# Patient Record
Sex: Female | Born: 1990 | Race: Black or African American | Hispanic: No | Marital: Single | Smoking: Never smoker
Health system: Southern US, Community
[De-identification: ages and names within clinical notes are randomized; demographics above are authoritative.]

## PROBLEM LIST (undated history)

## (undated) DIAGNOSIS — N949 Unspecified condition associated with female genital organs and menstrual cycle: Secondary | ICD-10-CM

## (undated) HISTORY — DX: Unspecified condition associated with female genital organs and menstrual cycle: N94.9

---
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
## 2009-01-12 ENCOUNTER — Emergency Department: Payer: Self-pay | Admitting: Emergency Medicine

## 2012-06-22 ENCOUNTER — Emergency Department: Payer: Self-pay | Admitting: Emergency Medicine

## 2012-12-06 ENCOUNTER — Emergency Department: Payer: Self-pay | Admitting: Emergency Medicine

## 2012-12-06 LAB — CBC
HCT: 30.3 % — ABNORMAL LOW (ref 35.0–47.0)
HGB: 10.3 g/dL — ABNORMAL LOW (ref 12.0–16.0)
MCHC: 33.9 g/dL (ref 32.0–36.0)
RBC: 3.59 10*6/uL — ABNORMAL LOW (ref 3.80–5.20)

## 2012-12-06 LAB — URINALYSIS, COMPLETE
Bilirubin,UR: NEGATIVE
Nitrite: NEGATIVE
Ph: 6 (ref 4.5–8.0)
WBC UR: 8 /HPF (ref 0–5)

## 2012-12-06 LAB — COMPREHENSIVE METABOLIC PANEL
Alkaline Phosphatase: 67 U/L (ref 50–136)
Anion Gap: 4 — ABNORMAL LOW (ref 7–16)
BUN: 9 mg/dL (ref 7–18)
Chloride: 108 mmol/L — ABNORMAL HIGH (ref 98–107)
EGFR (African American): >60
EGFR (Non-African Amer.): >60
Glucose: 63 mg/dL — ABNORMAL LOW (ref 65–99)
Osmolality: 274 (ref 275–301)
Potassium: 3.5 mmol/L (ref 3.5–5.1)
SGOT(AST): 22 U/L (ref 15–37)
Sodium: 139 mmol/L (ref 136–145)

## 2012-12-07 ENCOUNTER — Ambulatory Visit: Payer: Self-pay | Admitting: Physician Assistant

## 2013-08-20 ENCOUNTER — Emergency Department: Payer: Self-pay | Admitting: Emergency Medicine

## 2013-10-25 ENCOUNTER — Emergency Department: Payer: Self-pay | Admitting: Emergency Medicine

## 2013-12-07 ENCOUNTER — Emergency Department: Payer: Self-pay | Admitting: Emergency Medicine

## 2014-04-28 ENCOUNTER — Emergency Department: Payer: Self-pay | Admitting: Emergency Medicine

## 2014-08-28 ENCOUNTER — Emergency Department: Payer: Self-pay | Admitting: Emergency Medicine

## 2014-08-28 LAB — COMPREHENSIVE METABOLIC PANEL
ALK PHOS: 48 U/L
ALT: 17 U/L
AST: 25 U/L (ref 15–37)
Albumin: 3.4 g/dL (ref 3.4–5.0)
Anion Gap: 5 — ABNORMAL LOW (ref 7–16)
BUN: 5 mg/dL — ABNORMAL LOW (ref 7–18)
Bilirubin,Total: 0.9 mg/dL (ref 0.2–1.0)
CREATININE: 0.72 mg/dL (ref 0.60–1.30)
Calcium, Total: 8.6 mg/dL (ref 8.5–10.1)
Chloride: 106 mmol/L (ref 98–107)
Co2: 29 mmol/L (ref 21–32)
EGFR (African American): >60
EGFR (Non-African Amer.): >60
Glucose: 79 mg/dL (ref 65–99)
Osmolality: 276 (ref 275–301)
Potassium: 3.5 mmol/L (ref 3.5–5.1)
Sodium: 140 mmol/L (ref 136–145)
TOTAL PROTEIN: 6.9 g/dL (ref 6.4–8.2)

## 2014-08-28 LAB — URINALYSIS, COMPLETE
Bacteria: NONE SEEN
Bilirubin,UR: NEGATIVE
Blood: NEGATIVE
Glucose,UR: NEGATIVE mg/dL (ref 0–75)
Ketone: NEGATIVE
LEUKOCYTE ESTERASE: NEGATIVE
NITRITE: NEGATIVE
Ph: 7 (ref 4.5–8.0)
Protein: NEGATIVE
RBC,UR: <1 /HPF (ref 0–5)
SPECIFIC GRAVITY: 1.01 (ref 1.003–1.030)

## 2014-08-28 LAB — CBC WITH DIFFERENTIAL/PLATELET
BASOS ABS: 0.1 10*3/uL (ref 0.0–0.1)
Basophil %: 0.7 %
Eosinophil #: 0.1 10*3/uL (ref 0.0–0.7)
Eosinophil %: 1 %
HCT: 34.2 % — ABNORMAL LOW (ref 35.0–47.0)
HGB: 11.5 g/dL — ABNORMAL LOW (ref 12.0–16.0)
LYMPHS PCT: 22.5 %
Lymphocyte #: 2.1 10*3/uL (ref 1.0–3.6)
MCH: 29.5 pg (ref 26.0–34.0)
MCHC: 33.8 g/dL (ref 32.0–36.0)
MCV: 87 fL (ref 80–100)
MONOS PCT: 5.6 %
Monocyte #: 0.5 x10 3/mm (ref 0.2–0.9)
NEUTROS ABS: 6.5 10*3/uL (ref 1.4–6.5)
Neutrophil %: 70.2 %
Platelet: 263 10*3/uL (ref 150–440)
RBC: 3.92 10*6/uL (ref 3.80–5.20)
RDW: 14.4 % (ref 11.5–14.5)
WBC: 9.2 10*3/uL (ref 3.6–11.0)

## 2015-01-30 ENCOUNTER — Encounter: Payer: Self-pay | Admitting: Emergency Medicine

## 2015-01-30 ENCOUNTER — Emergency Department
Admission: EM | Admit: 2015-01-30 | Discharge: 2015-01-30 | Disposition: A | Discharge status: Home / Self Care | Payer: Self-pay | Attending: Emergency Medicine | Admitting: Emergency Medicine

## 2015-01-30 ENCOUNTER — Other Ambulatory Visit: Payer: Self-pay

## 2015-01-30 DIAGNOSIS — Y9389 Activity, other specified: Secondary | ICD-10-CM | POA: Insufficient documentation

## 2015-01-30 DIAGNOSIS — Y9289 Other specified places as the place of occurrence of the external cause: Secondary | ICD-10-CM | POA: Insufficient documentation

## 2015-01-30 DIAGNOSIS — S29011A Strain of muscle and tendon of front wall of thorax, initial encounter: Secondary | ICD-10-CM | POA: Insufficient documentation

## 2015-01-30 DIAGNOSIS — Y990 Civilian activity done for income or pay: Secondary | ICD-10-CM | POA: Insufficient documentation

## 2015-01-30 DIAGNOSIS — X58XXXA Exposure to other specified factors, initial encounter: Secondary | ICD-10-CM | POA: Insufficient documentation

## 2015-01-30 DIAGNOSIS — R0789 Other chest pain: Secondary | ICD-10-CM

## 2015-01-30 NOTE — ED Notes (Signed)
Pt to ed with c/o chest pain that started yesterday late in the afternoon. Pt reports she was at work yesterday when it started on the left side of her chest radiating to the back.  Reports she went home and rested.  This am awoke and started having chest pain again, sob and dizziness.

## 2015-01-30 NOTE — Discharge Instructions (Signed)
This discomfort appears to be musculoskeletal.  Take ibuprofen, 3 tablets (600 mg) 3 times a day for up to 3 days. Continue light activity as tolerated. Return to the emergency department if you worsening pain, shortness of breath, or if you have other urgent concerns. Chest Wall Pain Chest wall pain is pain in or around the bones and muscles of your chest. It may take up to 6 weeks to get better. It may take longer if you must stay physically active in your work and activities.  CAUSES  Chest wall pain may happen on its own. However, it may be caused by:  A viral illness like the flu.  Injury.  Coughing.  Exercise.  Arthritis.  Fibromyalgia.  Shingles. HOME CARE INSTRUCTIONS   Avoid overtiring physical activity. Try not to strain or perform activities that cause pain. This includes any activities using your chest or your abdominal and side muscles, especially if heavy weights are used.  Put ice on the sore area.  Put ice in a plastic bag.  Place a towel between your skin and the bag.  Leave the ice on for 15-20 minutes per hour while awake for the first 2 days.  Only take over-the-counter or prescription medicines for pain, discomfort, or fever as directed by your caregiver. SEEK IMMEDIATE MEDICAL CARE IF:   Your pain increases, or you are very uncomfortable.  You have a fever.  Your chest pain becomes worse.  You have new, unexplained symptoms.  You have nausea or vomiting.  You feel sweaty or lightheaded.  You have a cough with phlegm (sputum), or you cough up blood. MAKE SURE YOU:   Understand these instructions.  Will watch your condition.  Will get help right away if you are not doing well or get worse. Document Released: 07/21/2005 Document Revised: 10/13/2011 Document Reviewed: 03/17/2011 [facility] Patient Information 2015 [place], [place]. This information is not intended to replace advice given to you by your health care provider. Make sure you  discuss any questions you have with your health care provider.

## 2015-01-30 NOTE — ED Provider Notes (Signed)
[facility] Emergency Department Provider Note  ____________________________________________  Time seen:  [id number]  I have reviewed the triage vital signs and the nursing notes.   HISTORY  Chief Complaint Chest Pain     HPI Tonya Rodriguez is a 24 y.o. female reports she began to have chest pain yesterday while at work. She works in [employer]. She needs to lift items at work and was less comfortable with this yesterday, but she does not recall any injury or initiating event. The discomfort is in the left chest but also wraps around the chest wall and is in the left back a little bit. She slept through the night but woke this morning with additional pain in her left chest. She denies any shortness of breath. She has not taken any medications or tried any other therapeutics at this stage.  Patient denies any significant past medical history. She does not have hypertension, diabetes, heart disease, or respiratory issues including asthma.    History reviewed. No pertinent past medical history.  There are no active problems to display for this patient.   History reviewed. No pertinent past surgical history.  No current outpatient prescriptions on file.  Allergies Review of patient's allergies indicates no known allergies.  Family History  Problem Relation Age of Onset  . Cancer Mother   . Hypertension Father     Social History History  Substance Use Topics  . Smoking status: Never Smoker   . Smokeless tobacco: Not on file  . Alcohol Use: Yes    Review of Systems  Constitutional: Negative for fever. ENT: Negative for sore throat. Cardiovascular: Chest pain, see history of present illness Respiratory: Negative for shortness of breath. Gastrointestinal: Negative for abdominal pain, vomiting and diarrhea. Genitourinary: Negative for dysuria. Musculoskeletal: Left chest pain Skin: Negative for rash. Neurological: Negative for headaches   10-point  ROS otherwise negative.  ____________________________________________   PHYSICAL EXAM:  VITAL SIGNS: ED Triage Vitals  Enc Vitals Group     BP 01/30/15 0723 113/69 mmHg     Pulse Rate 01/30/15 0723 66     Resp 01/30/15 0723 18     Temp 01/30/15 0723 97.9 F (36.6 C)     Temp Source 01/30/15 0723 Oral     SpO2 01/30/15 0723 100 %     Weight 01/30/15 0723 170 lb (77.111 kg)     Height 01/30/15 0723 5\' 7"  (1.702 m)     Head Cir --      Peak Flow --      Pain Score 01/30/15 0725 6     Pain Loc --      Pain Edu? --      Excl. in GC? --     Constitutional: Alert and oriented. Well appearing and in no distress. ENT   Head: Normocephalic and atraumatic.   Nose: No congestion/rhinnorhea. Cardiovascular: Normal rate, regular rhythm, no murmur noted Respiratory:  Normal respiratory effort, no tachypnea.    Breath sounds are clear and equal bilaterally.  Chest wall: Notable tenderness to the left pectoralis muscle that extends into the sternum. She also has discomfort on palpation around the left lower ribs and the area lateral and inferior to her left scapula. There is no left paraspinal muscle tenderness. Gastrointestinal: Soft and nontender. No distention.  Back: No muscle spasm, no tenderness, no CVA tenderness. Musculoskeletal: No deformity noted. Nontender with normal range of motion in all extremities.  No noted edema. Neurologic:  Normal speech and language. No  gross focal neurologic deficits are appreciated.  Skin:  Skin is warm, dry. No rash noted. Psychiatric: Mood and affect are normal. Speech and behavior are normal.  ____________________________________________    EKG  ED ECG REPORT I, Sonna Lipsky W, the attending physician, personally viewed and interpreted this ECG.   Date: 01/30/2015  EKG Time: 724  Rate: 74  Rhythm: Normal sinus rhythm with sinus arrhythmia  Axis: Normal  Intervals: Normal  ST&T Change: None  noted   ____________________________________________   INITIAL IMPRESSION / ASSESSMENT AND PLAN / ED COURSE  This healthy well-appearing 24 year old female appears to have musculoskeletal pain in her left chest that wraps around near the lateral portion of her left scapula. She is breathing well and is in no acute distress. Her EKG is normal.  We have counseled her on the treatment for this musculoskeletal discomfort. This will include ibuprofen, 600 mg, 3 times a day, for 3 days. I have counseled her to continue light activity but to be careful not to overuse or strain the muscles involved.  The patient appears well and agrees with this treatment plan. I advised her to return to the emergency department if she has any further urgent concerns.  ____________________________________________   FINAL CLINICAL IMPRESSION(S) / ED DIAGNOSES  Final diagnoses:  Chest wall pain  Strain of left pectoralis muscle, initial encounter      Darien Ramus, MD 01/30/15 [phone]

## 2015-04-23 ENCOUNTER — Emergency Department
Admission: EM | Admit: 2015-04-23 | Discharge: 2015-04-23 | Disposition: A | Discharge status: Home / Self Care | Payer: No Typology Code available for payment source | Attending: Emergency Medicine | Admitting: Emergency Medicine

## 2015-04-23 ENCOUNTER — Encounter: Payer: Self-pay | Admitting: Emergency Medicine

## 2015-04-23 DIAGNOSIS — Y9389 Activity, other specified: Secondary | ICD-10-CM | POA: Diagnosis not present

## 2015-04-23 DIAGNOSIS — Y998 Other external cause status: Secondary | ICD-10-CM | POA: Diagnosis not present

## 2015-04-23 DIAGNOSIS — Y92410 Unspecified street and highway as the place of occurrence of the external cause: Secondary | ICD-10-CM | POA: Diagnosis not present

## 2015-04-23 DIAGNOSIS — S46911A Strain of unspecified muscle, fascia and tendon at shoulder and upper arm level, right arm, initial encounter: Secondary | ICD-10-CM | POA: Diagnosis not present

## 2015-04-23 DIAGNOSIS — S4991XA Unspecified injury of right shoulder and upper arm, initial encounter: Secondary | ICD-10-CM | POA: Diagnosis present

## 2015-04-23 MED ORDER — MELOXICAM 15 MG PO TABS: 15.0000 mg | ORAL_TABLET | Freq: Every day | ORAL | Status: DC

## 2015-04-23 MED ORDER — CYCLOBENZAPRINE HCL 10 MG PO TABS: 10.0000 mg | ORAL_TABLET | Freq: Three times a day (TID) | ORAL | Status: DC | PRN

## 2015-04-23 NOTE — Discharge Instructions (Signed)
Joint Sprain °A sprain is a tear or stretch in the ligaments that hold a joint together. Severe sprains may need as long as 3-6 weeks of immobilization and/or exercises to heal completely. Sprained joints should be rested and protected. If not, they can become unstable and prone to re-injury. Proper treatment can reduce your pain, shorten the period of disability, and reduce the risk of repeated injuries. °TREATMENT  °· Rest and elevate the injured joint to reduce pain and swelling. °· Apply ice packs to the injury for 20-30 minutes every 2-3 hours for the next 2-3 days. °· Keep the injury wrapped in a compression bandage or splint as long as the joint is painful or as instructed by your caregiver. °· Do not use the injured joint until it is completely healed to prevent re-injury and chronic instability. Follow the instructions of your caregiver. °· Long-term sprain management may require exercises and/or treatment by a physical therapist. Taping or special braces may help stabilize the joint until it is completely better. °SEEK MEDICAL CARE IF:  °· You develop increased pain or swelling of the joint. °· You develop increasing redness and warmth of the joint. °· You develop a fever. °· It becomes stiff. °· Your hand or foot gets cold or numb. °Document Released: 08/28/2004 Document Revised: 10/13/2011 Document Reviewed: 08/07/2008 °ExitCare® Patient Information ©2015 [employer]. This information is not intended to replace advice given to you by your health care provider. Make sure you discuss any questions you have with your health care provider. ° °
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
## 2015-07-02 ENCOUNTER — Emergency Department
Admission: EM | Admit: 2015-07-02 | Discharge: 2015-07-02 | Disposition: A | Discharge status: Home / Self Care | Payer: Self-pay | Attending: Emergency Medicine | Admitting: Emergency Medicine

## 2015-07-02 ENCOUNTER — Encounter: Payer: Self-pay | Admitting: Medical Oncology

## 2015-07-02 DIAGNOSIS — H9201 Otalgia, right ear: Secondary | ICD-10-CM | POA: Insufficient documentation

## 2015-07-02 DIAGNOSIS — J069 Acute upper respiratory infection, unspecified: Secondary | ICD-10-CM | POA: Insufficient documentation

## 2015-07-02 DIAGNOSIS — H9191 Unspecified hearing loss, right ear: Secondary | ICD-10-CM | POA: Insufficient documentation

## 2015-07-02 DIAGNOSIS — Z791 Long term (current) use of non-steroidal anti-inflammatories (NSAID): Secondary | ICD-10-CM | POA: Insufficient documentation

## 2015-07-02 MED ORDER — PREDNISONE 10 MG PO TABS: ORAL_TABLET | ORAL | Status: DC

## 2015-07-02 NOTE — ED Provider Notes (Signed)
[facility] Emergency Department Provider Note  ____________________________________________  Time seen: Approximately 8:41 AM  I have reviewed the triage vital signs and the nursing notes.   HISTORY  Chief Complaint Sore Throat and Otalgia  HPI Tonya Rodriguez is a 24 y.o. female Saturday with complaint of sore throat and right ear pain beginning last night.  Patient has not taken any over-the-counter medication for her symptoms. She denies any fever or chills. She has had some nasal congestion and cold symptoms but has not taken any over-the-counter medication for this. She denies the use of tobacco. She states that hearing from her right ear is decreased in comparison to the left. Pain is time is 6 out of 10.   History reviewed. No pertinent past medical history.  There are no active problems to display for this patient.   History reviewed. No pertinent past surgical history.  Current Outpatient Rx  Name  Route  Sig  Dispense  Refill  . cyclobenzaprine (FLEXERIL) 10 MG tablet   Oral   Take 1 tablet (10 mg total) by mouth 3 (three) times daily as needed for muscle spasms.   15 tablet   0   . meloxicam (MOBIC) 15 MG tablet   Oral   Take 1 tablet (15 mg total) by mouth daily.   30 tablet   0   . predniSONE (DELTASONE) 10 MG tablet      Take 3 tablet once a day for 3 days   9 tablet   0     Allergies Review of patient's allergies indicates no known allergies.  Family History  Problem Relation Age of Onset  . Cancer Mother   . Hypertension Father     Social History Social History  Substance Use Topics  . Smoking status: Never Smoker   . Smokeless tobacco: None  . Alcohol Use: Yes    Review of Systems Constitutional: No fever/chills Eyes: No visual changes. ENT: No sore throat. Nasal congestion Decreased hearing right ear Cardiovascular: Denies chest pain. Respiratory: Denies shortness of breath. Gastrointestinal No nausea, no  vomiting.  Musculoskeletal: Negative for back pain. Skin: Negative for rash. Neurological: Negative for headaches, focal weakness or numbness.  10-point ROS otherwise negative.  ____________________________________________   PHYSICAL EXAM:  VITAL SIGNS: ED Triage Vitals  Enc Vitals Group     BP 07/02/15 0751 121/66 mmHg     Pulse Rate 07/02/15 0751 65     Resp 07/02/15 0751 17     Temp 07/02/15 0751 97.7 F (36.5 C)     Temp Source 07/02/15 0751 Oral     SpO2 07/02/15 0751 99 %     Weight 07/02/15 0751 149 lb (67.586 kg)     Height 07/02/15 0751  (1.702 m)     Head Cir --      Peak Flow --      Pain Score 07/02/15 0751 6     Pain Loc --      Pain Edu? --      Excl. in GC? --     Constitutional: Alert and oriented. Well appearing and in no acute distress. Eyes: Conjunctivae are normal. PERRL. EOMI. Head: Atraumatic. Nose: Positive nasal congestion/rhinnorhea. Left EAC and TM are dull. Right EAC is clear right TM with mild fluid present no erythema seen. Mouth/Throat: Mucous membranes are moist.  Oropharynx non-erythematous. Posterior pharynx with drainage present. Neck: No stridor.   Hematological/Lymphatic/Immunilogical: No cervical lymphadenopathy. Cardiovascular: Normal rate, regular rhythm. Grossly normal  heart sounds.  Good peripheral circulation. Respiratory: Normal respiratory effort.  No retractions. Lungs CTAB. Gastrointestinal: Soft and nontender. No distention. No abdominal bruits. No CVA tenderness. Musculoskeletal: No lower extremity tenderness nor edema.  No joint effusions. Neurologic:  Normal speech and language. No gross focal neurologic deficits are appreciated. No gait instability. Skin:  Skin is warm, dry and intact. No rash noted. Psychiatric: Mood and affect are normal. Speech and behavior are normal.  ____________________________________________   LABS (all labs ordered are listed, but only abnormal results are displayed)  Labs Reviewed -  No data to display   PROCEDURES  Procedure(s) performed: None  Critical Care performed: No  ____________________________________________   INITIAL IMPRESSION / ASSESSMENT AND PLAN / ED COURSE  Pertinent labs & imaging results that were available during my care of the patient were reviewed by me and considered in my medical decision making (see chart for details).  Patient is told to obtain over-the-counter medication such as Claritin D or Zyrtec-D for nasal congestion. She is also given a prescription for prednisone 10 mg 3 mg once a day for 3 days. ____________________________________________   FINAL CLINICAL IMPRESSION(S) / ED DIAGNOSES  Final diagnoses:  Acute upper respiratory infection  Otalgia of right ear      Tommi RumpsRhonda L Keesha Pellum, PA-C 07/02/15 1147  Emily FilbertJonathan E Williams, MD 07/02/15 [phone]

## 2015-07-02 NOTE — Discharge Instructions (Signed)
Increase fluids, take prednisone as directed. Up to a and Sudafed over-the-counter for congestion and ear pain. Tylenol or Advil as needed for ear pain. Follow-up with Dr. Elenore RotaJuengel if any continued problems with her ear.

## 2015-07-02 NOTE — ED Notes (Signed)
Pt reports sore throat and rt ear pain that began last night without fever.

## 2015-08-06 ENCOUNTER — Emergency Department
Admission: EM | Admit: 2015-08-06 | Discharge: 2015-08-06 | Disposition: A | Discharge status: Home / Self Care | Payer: Self-pay | Attending: Emergency Medicine | Admitting: Emergency Medicine

## 2015-08-06 ENCOUNTER — Emergency Department: Payer: Self-pay

## 2015-08-06 ENCOUNTER — Encounter: Payer: Self-pay | Admitting: Emergency Medicine

## 2015-08-06 DIAGNOSIS — Z791 Long term (current) use of non-steroidal anti-inflammatories (NSAID): Secondary | ICD-10-CM | POA: Insufficient documentation

## 2015-08-06 DIAGNOSIS — M7918 Myalgia, other site: Secondary | ICD-10-CM

## 2015-08-06 DIAGNOSIS — M25511 Pain in right shoulder: Secondary | ICD-10-CM

## 2015-08-06 MED ORDER — TRAMADOL HCL 50 MG PO TABS
50.0000 mg | ORAL_TABLET | Freq: Once | ORAL | Status: AC
  Administered 2015-08-06: 50 mg via ORAL
  Filled 2015-08-06: qty 1

## 2015-08-06 MED ORDER — TRAMADOL HCL 50 MG PO TABS
50.0000 mg | ORAL_TABLET | Freq: Four times a day (QID) | ORAL | Status: DC | PRN
Start: 2015-08-06 — End: 2016-05-08

## 2015-08-06 MED ORDER — KETOROLAC TROMETHAMINE 60 MG/2ML IM SOLN
60.0000 mg | Freq: Once | INTRAMUSCULAR | Status: AC
  Administered 2015-08-06: 60 mg via INTRAMUSCULAR
  Filled 2015-08-06: qty 2

## 2015-08-06 NOTE — Discharge Instructions (Signed)
Joint Pain  Joint pain, which is also called arthralgia, can be caused by many things. Joint pain often goes away when you follow your health care provider's instructions for relieving pain at home. However, joint pain can also be caused by conditions that require further treatment. Common causes of joint pain include:   Bruising in the area of the joint.   Overuse of the joint.   Wear and tear on the joints that occur with aging (osteoarthritis).   Various other forms of arthritis.   A buildup of a crystal form of uric acid in the joint (gout).   Infections of the joint (septic arthritis) or of the bone (osteomyelitis).  Your health care provider may recommend medicine to help with the pain. If your joint pain continues, additional tests may be needed to diagnose your condition.  HOME CARE INSTRUCTIONS  Watch your condition for any changes. Follow these instructions as directed to lessen the pain that you are feeling.   Take medicines only as directed by your health care provider.   Rest the affected area for as long as your health care provider says that you should. If directed to do so, raise the painful joint above the level of your heart while you are sitting or lying down.   Do not do things that cause or worsen pain.   If directed, apply ice to the painful area:    Put ice in a plastic bag.    Place a towel between your skin and the bag.    Leave the ice on for 20 minutes, 2-3 times per day.   Wear an elastic bandage, splint, or sling as directed by your health care provider. Loosen the elastic bandage or splint if your fingers or toes become numb and tingle, or if they turn cold and blue.   Begin exercising or stretching the affected area as directed by your health care provider. Ask your health care provider what types of exercise are safe for you.   Keep all follow-up visits as directed by your health care provider. This is important.  SEEK MEDICAL CARE IF:   Your pain increases, and medicine  does not help.   Your joint pain does not improve within 3 days.   You have increased bruising or swelling.   You have a fever.   You lose 10 lb (4.5 kg) or more without trying.  SEEK IMMEDIATE MEDICAL CARE IF:   You are not able to move the joint.   Your fingers or toes become numb or they turn cold and blue.     This information is not intended to replace advice given to you by your health care provider. Make sure you discuss any questions you have with your health care provider.     Document Released: 07/21/2005 Document Revised: 08/11/2014 Document Reviewed: 05/02/2014  Elsevier Interactive Patient Education 2016 [employer]      Shoulder Pain  The shoulder is the joint that connects your arms to your body. The bones that form the shoulder joint include the upper arm bone (humerus), the shoulder blade (scapula), and the collarbone (clavicle). The top of the humerus is shaped like a ball and fits into a rather flat socket on the scapula (glenoid cavity). A combination of muscles and strong, fibrous tissues that connect muscles to bones (tendons) support your shoulder joint and hold the ball in the socket. Small, fluid-filled sacs (bursae) are located in different areas of the joint. They act as cushions between   the bones and the overlying soft tissues and help reduce friction between the gliding tendons and the bone as you move your arm. Your shoulder joint allows a wide range of motion in your arm. This range of motion allows you to do things like scratch your back or throw a ball. However, this range of motion also makes your shoulder more prone to pain from overuse and injury.  Causes of shoulder pain can originate from both injury and overuse and usually can be grouped in the following four categories:   Redness, swelling, and pain (inflammation) of the tendon (tendinitis) or the bursae (bursitis).   Instability, such as a dislocation of the joint.   Inflammation of the joint  (arthritis).   Broken bone (fracture).  HOME CARE INSTRUCTIONS    Apply ice to the sore area.    Put ice in a plastic bag.    Place a towel between your skin and the bag.    Leave the ice on for 15-20 minutes, 3-4 times per day for the first 2 days, or as directed by your health care provider.   Stop using cold packs if they do not help with the pain.   If you have a shoulder sling or immobilizer, wear it as long as your caregiver instructs. Only remove it to shower or bathe. Move your arm as little as possible, but keep your hand moving to prevent swelling.   Squeeze a soft ball or foam pad as much as possible to help prevent swelling.   Only take over-the-counter or prescription medicines for pain, discomfort, or fever as directed by your caregiver.  SEEK MEDICAL CARE IF:    Your shoulder pain increases, or new pain develops in your arm, hand, or fingers.   Your hand or fingers become cold and numb.   Your pain is not relieved with medicines.  SEEK IMMEDIATE MEDICAL CARE IF:    Your arm, hand, or fingers are numb or tingling.   Your arm, hand, or fingers are significantly swollen or turn white or blue.  MAKE SURE YOU:    Understand these instructions.   Will watch your condition.   Will get help right away if you are not doing well or get worse.     This information is not intended to replace advice given to you by your health care provider. Make sure you discuss any questions you have with your health care provider.     Document Released: 04/30/2005 Document Revised: 08/11/2014 Document Reviewed: 11/13/2014  Elsevier Interactive Patient Education 2016 [employer]vier Inc[employer]

## 2015-08-06 NOTE — ED Notes (Signed)
NAD noted at time of D/C. Pt denies questions or concerns. Pt ambulatory to the lobby at this time. Pt refused wheelchair to the lobby.  

## 2015-08-06 NOTE — ED Provider Notes (Signed)
[facility] Emergency Department Provider Note  ____________________________________________  Time seen: Approximately 549 AM  I have reviewed the triage vital signs and the nursing notes.   HISTORY  Chief Complaint Shoulder Pain    HPI Tonya Rodriguez is a 25 y.o. female comes into the hospital today with right shoulder pain. The patient reports it is uncomfortable and hurts down to her wrist. The patient reports that the pain started hurting on Thursday and she tried to brush it off. The patient is not taking anything for pain that has put heat on it. 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 The patient reports that the pain is all over her shoulder not just in one location and her pain as an 8 out of 10 in intensity. The patient could not tolerate the pain anymore so she decided to come in for evaluation.   History reviewed. No pertinent past medical history.  There are no active problems to display for this patient.   History reviewed. No pertinent past surgical history.  Current Outpatient Rx  Name  Route  Sig  Dispense  Refill  . cyclobenzaprine (FLEXERIL) 10 MG tablet   Oral   Take 1 tablet (10 mg total) by mouth 3 (three) times daily as needed for muscle spasms.   15 tablet   0   . meloxicam (MOBIC) 15 MG tablet   Oral   Take 1 tablet (15 mg total) by mouth daily.   30 tablet   0   . predniSONE (DELTASONE) 10 MG tablet      Take 3 tablet once a day for 3 days   9 tablet   0   . traMADol (ULTRAM) 50 MG tablet   Oral   Take 1 tablet (50 mg total) by mouth every 6 (six) hours as needed.   12 tablet   0     Allergies Review of patient's allergies indicates no known allergies.  Family History  Problem Relation Age of Onset  . Cancer Mother   . Hypertension Father     Social History Social History  Substance Use Topics  . Smoking status: Never Smoker   .  Smokeless tobacco: None  . Alcohol Use: Yes    Review of Systems Constitutional: No fever/chills Eyes: No visual changes. ENT: No sore throat. Cardiovascular: Denies chest pain. Respiratory: Denies shortness of breath. Gastrointestinal: No abdominal pain.  No nausea, no vomiting.  No diarrhea.  No constipation. Genitourinary: Negative for dysuria. Musculoskeletal: Right shoulder pain Skin: Negative for rash. Neurological: Negative for headaches, focal weakness or numbness.  10-point ROS otherwise negative.  ____________________________________________   PHYSICAL EXAM:  VITAL SIGNS: ED Triage Vitals  Enc Vitals Group     BP 08/06/15 0511 105/66 mmHg     Pulse Rate 08/06/15 0511 60     Resp 08/06/15 0511 18     Temp 08/06/15 0511 97.7 F (36.5 C)     Temp Source 08/06/15 0511 Oral     SpO2 08/06/15 0511 100 %     Weight 08/06/15 0511 160 lb (72.576 kg)     Height 08/06/15 0511 5\' 7"  (1.702 m)     Head Cir --      Peak Flow --      Pain Score --      Pain Loc --      Pain Edu? --  Excl. in GC? --     Constitutional: Alert and oriented. Well appearing and in mild distress. Eyes: Conjunctivae are normal. PERRL. EOMI. Head: Atraumatic. Nose: No congestion/rhinnorhea. Mouth/Throat: Mucous membranes are moist.  Oropharynx non-erythematous. Cardiovascular: Normal rate, regular rhythm. Grossly normal heart sounds.  Good peripheral circulation. Respiratory: Normal respiratory effort.  No retractions. Lungs CTAB. Gastrointestinal: Soft and nontender. No distention. Positive bowel sounds Musculoskeletal: No lower extremity tenderness nor edema. The patient has some right shoulder pain. Worse with range of motion that goes above level of her shoulder. Tenderness to palpation all over. Neurologic:  Normal speech and language.  Skin:  Skin is warm, dry and intact.  Psychiatric: Mood and affect are normal.   ____________________________________________   LABS (all labs  ordered are listed, but only abnormal results are displayed)  Labs Reviewed - No data to display ____________________________________________  EKG  None ____________________________________________  RADIOLOGY  Right shoulder x-ray: Lucency along the neck of scapula which may be artifactual in nature. Would correlate for any symptoms to suggest fracture. No additional evidence of fracture.  ____________________________________________   PROCEDURES  Procedure(s) performed: None  Critical Care performed: No  ____________________________________________   INITIAL IMPRESSION / ASSESSMENT AND PLAN / ED COURSE  Pertinent labs & imaging results that were available during my care of the patient were reviewed by me and considered in my medical decision making (see chart for details).  This is a 25 year old female who comes into the hospital today with right shoulder pain. I'll give her dose of Toradol and tramadol and do an x-ray of her shoulder. I will reassess the patient when she's had her x-ray and she's received her medication.  Patient reports her pain is somewhat improved. The patient denies any trauma so I do not feel that this lucency as a cause of the patient's pain. I will discharge the patient to have her follow up with orthopedic surgery if she may have some rotator cuff injury or intra-articular pathology causing her symptoms.I discussed this with the patient and she agrees with the plan. The patient will be discharged home. ____________________________________________   FINAL CLINICAL IMPRESSION(S) / ED DIAGNOSES  Final diagnoses:  Right shoulder pain  Musculoskeletal pain      Rebecka Apley, MD 08/06/15 [phone]

## 2015-08-06 NOTE — ED Notes (Signed)
Patient ambulatory to triage with steady gait, without difficulty or distress noted; pt c/o right shoulder pain since Thursday, worse with ROM; denies any known injury

## 2016-05-08 ENCOUNTER — Emergency Department
Admission: EM | Admit: 2016-05-08 | Discharge: 2016-05-08 | Disposition: A | Discharge status: Home / Self Care | Payer: Self-pay | Attending: Emergency Medicine | Admitting: Emergency Medicine

## 2016-05-08 ENCOUNTER — Encounter: Payer: Self-pay | Admitting: Medical Oncology

## 2016-05-08 DIAGNOSIS — N390 Urinary tract infection, site not specified: Secondary | ICD-10-CM | POA: Insufficient documentation

## 2016-05-08 DIAGNOSIS — R319 Hematuria, unspecified: Secondary | ICD-10-CM

## 2016-05-08 LAB — URINALYSIS COMPLETE WITH MICROSCOPIC (ARMC ONLY)
BILIRUBIN URINE: NEGATIVE
Bacteria, UA: NONE SEEN
GLUCOSE, UA: NEGATIVE mg/dL
KETONES UR: NEGATIVE mg/dL
NITRITE: NEGATIVE
PH: 7 (ref 5.0–8.0)
Protein, ur: 30 mg/dL — AB
Specific Gravity, Urine: 1.023 (ref 1.005–1.030)

## 2016-05-08 LAB — POCT PREGNANCY, URINE: Preg Test, Ur: NEGATIVE

## 2016-05-08 MED ORDER — PHENAZOPYRIDINE HCL 200 MG PO TABS: 200.0000 mg | ORAL_TABLET | Freq: Three times a day (TID) | ORAL | 0 refills | Status: DC | PRN

## 2016-05-08 MED ORDER — SULFAMETHOXAZOLE-TRIMETHOPRIM 800-160 MG PO TABS: 1.0000 | ORAL_TABLET | Freq: Two times a day (BID) | ORAL | 0 refills | Status: DC

## 2016-05-08 NOTE — ED Provider Notes (Signed)
[facility] Emergency Department Provider Note   ____________________________________________   First MD Initiated Contact with Patient 05/08/16 1302     (approximate)  I have reviewed the triage vital signs and the nursing notes.   HISTORY  Chief Complaint Vaginal Bleeding    HPI Carolanne GrumblingJasmine E Allie is a 25 y.o. female presents for evaluation of bloody urine and blood on tissue after she wipes from urination. Denies any menstrual-like periods at this time states that her periods under for couple days. States that there is no bleeding outside of urination.   History reviewed. No pertinent past medical history.  There are no active problems to display for this patient.   History reviewed. No pertinent surgical history.  Prior to Admission medications   Medication Sig Start Date End Date Taking? Authorizing Provider  phenazopyridine (PYRIDIUM) 200 MG tablet Take 1 tablet (200 mg total) by mouth 3 (three) times daily as needed for pain. 05/08/16   Charmayne Sheerharles M Loralyn Rachel, PA-C  sulfamethoxazole-trimethoprim (BACTRIM DS,SEPTRA DS) 800-160 MG tablet Take 1 tablet by mouth 2 (two) times daily. 05/08/16   Evangeline Dakinharles M Shannyn Jankowiak, PA-C    Allergies Review of patient's allergies indicates no known allergies.  Family History  Problem Relation Age of Onset  . Cancer Mother   . Hypertension Father     Social History Social History  Substance Use Topics  . Smoking status: Never Smoker  . Smokeless tobacco: Not on file  . Alcohol use Yes    Review of Systems Constitutional: No fever/chills Cardiovascular: Denies chest pain. Respiratory: Denies shortness of breath. Gastrointestinal: No abdominal pain.  No nausea, no vomiting.  No diarrhea.  No constipation. Genitourinary: Positive for dysuria. Musculoskeletal: Negative for back pain. Skin: Negative for rash. Neurological: Negative for headaches, focal weakness or numbness.  10-point ROS otherwise  negative.  ____________________________________________   PHYSICAL EXAM:  VITAL SIGNS: ED Triage Vitals [05/08/16 1154]  Enc Vitals Group     BP 117/73     Pulse Rate 79     Resp 17     Temp 98.1 F (36.7 C)     Temp Source Oral     SpO2 99 %     Weight 154 lb (69.9 kg)     Height 5\' 7"  (1.702 m)     Head Circumference      Peak Flow      Pain Score      Pain Loc      Pain Edu?      Excl. in GC?     Constitutional: Alert and oriented. Well appearing and in no acute distress.  Cardiovascular: Normal rate, regular rhythm. Grossly normal heart sounds.  Good peripheral circulation. Respiratory: Normal respiratory effort.  No retractions. Lungs CTAB. Gastrointestinal: Soft and nontender. No distention. No abdominal bruits. No CVA tenderness. Musculoskeletal: No lower extremity tenderness nor edema.  No joint effusions. Neurologic:  Normal speech and language. No gross focal neurologic deficits are appreciated. No gait instability. Skin:  Skin is warm, dry and intact. No rash noted. Psychiatric: Mood and affect are normal. Speech and behavior are normal.  ____________________________________________   LABS (all labs ordered are listed, but only abnormal results are displayed)  Labs Reviewed  URINALYSIS COMPLETEWITH MICROSCOPIC ([facility] ONLY) - Abnormal; Notable for the following:       Result Value   Color, Urine YELLOW (*)    APPearance CLEAR (*)    Hgb urine dipstick 2+ (*)    Protein, ur 30 (*)  Leukocytes, UA TRACE (*)    Squamous Epithelial / LPF 6-30 (*)    All other components within normal limits  POC URINE PREG, ED  POCT PREGNANCY, URINE   ____________________________________________  EKG   ____________________________________________  RADIOLOGY   ____________________________________________   PROCEDURES  Procedure(s) performed: None  Procedures  Critical Care performed: No  ____________________________________________   INITIAL  IMPRESSION / ASSESSMENT AND PLAN / ED COURSE  Pertinent labs & imaging results that were available during my care of the patient were reviewed by me and considered in my medical decision making (see chart for details).  Acute urinary tract infection. Rx given for Bactrim DS twice a day 20) 200 mg 3 times a day #6. Patient follow-up with PCP or return to ER for any worsening symptomology.  Clinical Course     ____________________________________________   FINAL CLINICAL IMPRESSION(S) / ED DIAGNOSES  Final diagnoses:  Urinary tract infection with hematuria, site unspecified      NEW MEDICATIONS STARTED DURING THIS VISIT:  New Prescriptions   PHENAZOPYRIDINE (PYRIDIUM) 200 MG TABLET    Take 1 tablet (200 mg total) by mouth 3 (three) times daily as needed for pain.   SULFAMETHOXAZOLE-TRIMETHOPRIM (BACTRIM DS,SEPTRA DS) 800-160 MG TABLET    Take 1 tablet by mouth 2 (two) times daily.     Note:  This document was prepared using Dragon voice recognition software and may include unintentional dictation errors.   Evangeline Dakin, PA-C 05/08/16 1306    Jeanmarie Plant, MD 05/08/16 [phone]

## 2016-05-08 NOTE — ED Notes (Signed)
Patient presents to the ED with spotting blood x 2 days with right pelvic pain.  Patient denies pain at this time.  Patient states she is having an increased urgency to urinate.

## 2016-05-08 NOTE — ED Triage Notes (Addendum)
Pt reports that she began having vaginal bleeding yesterday with some lower abd cramping, pt reports that she only has the bleeding with wiping and her period is not due until next week so pt reports that she is concerned. Pt denies pain at this time.

## 2016-09-14 ENCOUNTER — Emergency Department: Payer: Self-pay

## 2016-09-14 ENCOUNTER — Emergency Department
Admission: EM | Admit: 2016-09-14 | Discharge: 2016-09-14 | Disposition: A | Discharge status: Home / Self Care | Payer: Self-pay | Attending: Emergency Medicine | Admitting: Emergency Medicine

## 2016-09-14 DIAGNOSIS — J111 Influenza due to unidentified influenza virus with other respiratory manifestations: Secondary | ICD-10-CM | POA: Insufficient documentation

## 2016-09-14 DIAGNOSIS — Z79899 Other long term (current) drug therapy: Secondary | ICD-10-CM | POA: Insufficient documentation

## 2016-09-14 MED ORDER — IBUPROFEN 800 MG PO TABS
800.0000 mg | ORAL_TABLET | Freq: Once | ORAL | Status: AC
  Administered 2016-09-14: 800 mg via ORAL
  Filled 2016-09-14: qty 1

## 2016-09-14 NOTE — ED Notes (Signed)
MD Brown at bedside.

## 2016-09-14 NOTE — ED Provider Notes (Signed)
[facility] Emergency Department Provider Note   First MD Initiated Contact with Patient 09/14/16 [phone]     (approximate)  I have reviewed the triage vital signs and the nursing notes.   HISTORY  Chief Complaint Fever   HPI Tonya Rodriguez is a 26 y.o. female presents with fever cough congestion 4 days. No known sick contact positive tobacco use   Past medical history None There are no active problems to display for this patient.  Surgical history None  Prior to Admission medications   Medication Sig Start Date End Date Taking? Authorizing Provider  phenazopyridine (PYRIDIUM) 200 MG tablet Take 1 tablet (200 mg total) by mouth 3 (three) times daily as needed for pain. 05/08/16   Charmayne Sheer Beers, PA-C  sulfamethoxazole-trimethoprim (BACTRIM DS,SEPTRA DS) 800-160 MG tablet Take 1 tablet by mouth 2 (two) times daily. 05/08/16   Evangeline Dakin, PA-C    Allergies Patient has no known allergies.  Family History  Problem Relation Age of Onset  . Cancer Mother   . Hypertension Father     Social History Social History  Substance Use Topics  . Smoking status: Never Smoker  . Smokeless tobacco: Not on file  . Alcohol use Yes    Review of Systems Constitutional: Positive for fever/chills Eyes: No visual changes. ENT: No sore throat.Positive for nasal congestion Cardiovascular: Denies chest pain. Respiratory: Denies shortness of breath.Positive for cough Gastrointestinal: No abdominal pain.  No nausea, no vomiting.  No diarrhea.  No constipation. Genitourinary: Negative for dysuria. Musculoskeletal: Negative for back pain. Skin: Negative for rash. Neurological: Negative for headaches, focal weakness or numbness.  10-point ROS otherwise negative.  ____________________________________________   PHYSICAL EXAM:  VITAL SIGNS: ED Triage Vitals  Enc Vitals Group     BP 09/14/16 0052 121/85     Pulse Rate 09/14/16 0052 100     Resp 09/14/16  0052 20     Temp 09/14/16 0052 (!) 101.6 F (38.7 C)     Temp Source 09/14/16 0052 Oral     SpO2 09/14/16 0052 98 %     Weight 09/14/16 0049 158 lb (71.7 kg)     Height 09/14/16 0049 5\' 7"  (1.702 m)     Head Circumference --      Peak Flow --      Pain Score --      Pain Loc --      Pain Edu? --      Excl. in GC? --     Constitutional: Alert and oriented. Well appearing and in no acute distress. Eyes: Conjunctivae are normal. PERRL. EOMI. Head: Atraumatic. Nose: No congestion/rhinnorhea. Mouth/Throat: Mucous membranes are moist. Neck: No stridor.   Cardiovascular: Normal rate, regular rhythm. Good peripheral circulation. Grossly normal heart sounds. Respiratory: Normal respiratory effort.  No retractions. Lungs CTAB. Gastrointestinal: Soft and nontender. No distention.  Musculoskeletal: No lower extremity tenderness nor edema. No gross deformities of extremities. Neurologic:  Normal speech and language. No gross focal neurologic deficits are appreciated.  Skin:  Skin is warm, dry and intact. No rash noted. Psychiatric: Mood and affect are normal. Speech and behavior are normal.    RADIOLOGY I, Woodson N BROWN, personally viewed and evaluated these images (plain radiographs) as part of my medical decision making, as well as reviewing the written report by the radiologist.  Dg Chest 2 View  Result Date: 09/14/2016 CLINICAL DATA:  26 y/o  F; cough, fever, and shortness of breath. EXAM: CHEST  2  VIEW COMPARISON:  04/28/2014 chest radiograph FINDINGS: Stable heart size and mediastinal contours are within normal limits. Both lungs are clear. Mild S-shaped curvature of the spine. IMPRESSION: No active cardiopulmonary disease. Electronically Signed   By: Mitzi HansenLance  Furusawa-Stratton M.D.   On: 09/14/2016 04:43      Procedures     INITIAL IMPRESSION / ASSESSMENT AND PLAN / ED COURSE  Pertinent labs & imaging results that were available during my care of the patient were reviewed  by me and considered in my medical decision making (see chart for details).        ____________________________________________  FINAL CLINICAL IMPRESSION(S) / ED DIAGNOSES  Final diagnoses:  Influenza     MEDICATIONS GIVEN DURING THIS VISIT:  Medications  ibuprofen (ADVIL,MOTRIN) tablet 800 mg (800 mg Oral Given 09/14/16 0055)     NEW OUTPATIENT MEDICATIONS STARTED DURING THIS VISIT:  Discharge Medication List as of 09/14/2016  4:50 AM      Discharge Medication List as of 09/14/2016  4:50 AM      Discharge Medication List as of 09/14/2016  4:50 AM       Note:  This document was prepared using Dragon voice recognition software and may include unintentional dictation errors.    Darci Currentandolph N Brown, MD 09/14/16 [phone]

## 2016-09-14 NOTE — ED Triage Notes (Signed)
Reports fever off and on since Tuesday.

## 2016-09-14 NOTE — ED Notes (Signed)
Patient c/o fever beginning Wednesday, tmax 103. Pt has been treating with tylenol, motrin, and alkaseltzer.  Pt c/o nasal congestion/drainage, nonproductive cough, intermittent SOB.

## 2017-08-06 ENCOUNTER — Other Ambulatory Visit: Payer: Self-pay

## 2017-08-06 ENCOUNTER — Emergency Department
Admission: EM | Admit: 2017-08-06 | Discharge: 2017-08-06 | Disposition: A | Discharge status: Home / Self Care | Payer: Self-pay | Attending: Emergency Medicine | Admitting: Emergency Medicine

## 2017-08-06 DIAGNOSIS — R109 Unspecified abdominal pain: Secondary | ICD-10-CM

## 2017-08-06 DIAGNOSIS — R1031 Right lower quadrant pain: Secondary | ICD-10-CM | POA: Insufficient documentation

## 2017-08-06 LAB — CBC
HEMATOCRIT: 34.4 % — AB (ref 35.0–47.0)
Hemoglobin: 11.9 g/dL — ABNORMAL LOW (ref 12.0–16.0)
MCH: 29.6 pg (ref 26.0–34.0)
MCHC: 34.5 g/dL (ref 32.0–36.0)
MCV: 85.6 fL (ref 80.0–100.0)
Platelets: 306 10*3/uL (ref 150–440)
RBC: 4.02 MIL/uL (ref 3.80–5.20)
RDW: 14.4 % (ref 11.5–14.5)
WBC: 8.9 10*3/uL (ref 3.6–11.0)

## 2017-08-06 LAB — BASIC METABOLIC PANEL
ANION GAP: 6 (ref 5–15)
BUN: 13 mg/dL (ref 6–20)
CO2: 26 mmol/L (ref 22–32)
Calcium: 8.9 mg/dL (ref 8.9–10.3)
Chloride: 105 mmol/L (ref 101–111)
Creatinine, Ser: 0.78 mg/dL (ref 0.44–1.00)
GFR calc Af Amer: >60 mL/min (ref >60)
GFR calc non Af Amer: >60 mL/min (ref >60)
GLUCOSE: 86 mg/dL (ref 65–99)
Potassium: 3.7 mmol/L (ref 3.5–5.1)
Sodium: 137 mmol/L (ref 135–145)

## 2017-08-06 LAB — URINALYSIS, COMPLETE (UACMP) WITH MICROSCOPIC
BACTERIA UA: NONE SEEN
BILIRUBIN URINE: NEGATIVE
Glucose, UA: NEGATIVE mg/dL
HGB URINE DIPSTICK: NEGATIVE
KETONES UR: 5 mg/dL — AB
NITRITE: NEGATIVE
PROTEIN: NEGATIVE mg/dL
Specific Gravity, Urine: 1.027 (ref 1.005–1.030)
pH: 5 (ref 5.0–8.0)

## 2017-08-06 LAB — POCT PREGNANCY, URINE
Preg Test, Ur: NEGATIVE
Preg Test, Ur: NEGATIVE

## 2017-08-06 NOTE — ED Notes (Signed)
Lab notified to add on Urine Culture. 

## 2017-08-06 NOTE — ED Notes (Signed)
Lab notified to add on Urinalysis. 

## 2017-08-06 NOTE — ED Triage Notes (Signed)
Pt states R flank pain that began yesterday with some bleeding. Pt unsure if vaginal or rectal. "when I wipe I see it." no bleeding noted on a pad, only when she wipes. Alert, oriented, ambulatory. Denies urinary symptoms.

## 2017-08-06 NOTE — ED Provider Notes (Signed)
[facility] Emergency Department Provider Note  Time seen: 4:05 PM  I have reviewed the triage vital signs and the nursing notes.   HISTORY  Chief Complaint Flank Pain (possible vaginal bleed/rectal bleed )    HPI Tonya Rodriguez is a 27 y.o. female with no past medical history who presents to the emergency department for right flank pain and possible hematuria.  According to the patient for the past 2 days she has been experiencing pain in the right side of her abdomen.  Denies any fever, nausea, vomiting, diarrhea, dysuria.  Last menstrual period was approximately 1 month ago.  She states today anytime she urinates and wipes she notes there to be some blood on the toilet paper.  Denies any bloody or black stool known to the patient.  Denies any vaginal bleeding known to the patient.   History reviewed. No pertinent past medical history.  There are no active problems to display for this patient.   History reviewed. No pertinent surgical history.  Prior to Admission medications   Medication Sig Start Date End Date Taking? Authorizing Provider  phenazopyridine (PYRIDIUM) 200 MG tablet Take 1 tablet (200 mg total) by mouth 3 (three) times daily as needed for pain. 05/08/16   Beers, Charmayne Sheer, PA-C  sulfamethoxazole-trimethoprim (BACTRIM DS,SEPTRA DS) 800-160 MG tablet Take 1 tablet by mouth 2 (two) times daily. 05/08/16   Beers, Charmayne Sheer, PA-C    No Known Allergies  Family History  Problem Relation Age of Onset  . Cancer Mother   . Hypertension Father     Social History Social History   Tobacco Use  . Smoking status: Never Smoker  Substance Use Topics  . Alcohol use: Yes  . Drug use: No    Review of Systems Constitutional: Negative for fever. Eyes: Negative for visual complaint ENT: Negative for congestion Cardiovascular: Negative for chest pain. Respiratory: Negative for shortness of breath. Gastrointestinal: Intermittent right flank pain.   Negative for nausea vomiting diarrhea.  Negative for black or bloody stool. Genitourinary: Negative for dysuria.  Denies vaginal bleeding.  Denies any known hematuria. Musculoskeletal: Negative for back pain. Skin: Negative for rash. Neurological: Negative for headache All other ROS negative  ____________________________________________   PHYSICAL EXAM:  VITAL SIGNS: ED Triage Vitals  Enc Vitals Group     BP 08/06/17 1328 125/86     Pulse Rate 08/06/17 1328 66     Resp 08/06/17 1328 20     Temp --      Temp src --      SpO2 08/06/17 1328 100 %     Weight 08/06/17 1329 168 lb (76.2 kg)     Height 08/06/17 1329 5\' 7"  (1.702 m)     Head Circumference --      Peak Flow --      Pain Score 08/06/17 1344 7     Pain Loc --      Pain Edu? --      Excl. in GC? --     Constitutional: Alert and oriented. Well appearing and in no distress. Eyes: Normal exam ENT   Head: Normocephalic and atraumatic   Mouth/Throat: Mucous membranes are moist. Cardiovascular: Normal rate, regular rhythm. No murmur Respiratory: Normal respiratory effort without tachypnea nor retractions. Breath sounds are clear  Gastrointestinal: S soft, mild suprapubic tenderness, no rebound or guarding.  No distention.  No right lower quadrant or right upper quadrant tenderness.  No CVA tenderness. Musculoskeletal: Nontender with normal range of motion  in all extremities. No lower extremity tenderness or edema. Neurologic:  Normal speech and language. No gross focal neurologic deficits Skin:  Skin is warm, dry and intact.  Psychiatric: Mood and affect are normal.   ____________________________________________   INITIAL IMPRESSION / ASSESSMENT AND PLAN / ED COURSE  Pertinent labs & imaging results that were available during my care of the patient were reviewed by me and considered in my medical decision making (see chart for details).  Patient presents to the emergency department for intermittent right flank  pain over the past 2 days as well as possible hematuria/blood when she wipes.  Differential would include urinary tract infection, pyelonephritis, ureterolithiasis, vaginal bleeding.  Patient's labs are largely within normal limits, pregnancy test is negative, blood work normal.  Urinalysis is pending at this time.  Patient continues to appear very well, nontender abdominal exam.  Urinalysis is normal.  Given the patient's normal labs I discussed imaging with the patient however at this time we agreed to hold off and monitor this at home.  The abdominal pain worsens or the patient spikes a fever she is to return to the emergency department otherwise follow-up with a primary care doctor.  Did perform a visual inspection, no obvious vaginal bleeding, no obvious hemorrhoid overall patient appears well, states she would prefer to avoid CT imaging would rather wait and see if her period starts and if the pain alleviates.  Given her normal labs and normal vitals I believe this is a reasonable plan of care.  Patient will be discharged from the emergency department.  ____________________________________________   FINAL CLINICAL IMPRESSION(S) / ED DIAGNOSES  Right flank pain    Minna AntisPaduchowski, Achillies Buehl, MD 08/06/17 1715

## 2017-08-07 LAB — URINE CULTURE

## 2018-03-04 DIAGNOSIS — N949 Unspecified condition associated with female genital organs and menstrual cycle: Secondary | ICD-10-CM

## 2018-03-04 HISTORY — DX: Unspecified condition associated with female genital organs and menstrual cycle: N94.9

## 2018-03-09 ENCOUNTER — Encounter: Payer: Self-pay | Admitting: Emergency Medicine

## 2018-03-09 DIAGNOSIS — N83202 Unspecified ovarian cyst, left side: Secondary | ICD-10-CM | POA: Insufficient documentation
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
## 2018-03-10 ENCOUNTER — Emergency Department: Payer: Self-pay

## 2018-03-10 ENCOUNTER — Emergency Department
Admission: EM | Admit: 2018-03-10 | Discharge: 2018-03-10 | Disposition: A | Discharge status: Home / Self Care | Payer: Self-pay | Attending: Emergency Medicine | Admitting: Emergency Medicine

## 2018-03-10 DIAGNOSIS — N83202 Unspecified ovarian cyst, left side: Secondary | ICD-10-CM

## 2018-03-10 LAB — URINALYSIS, COMPLETE (UACMP) WITH MICROSCOPIC
Bilirubin Urine: NEGATIVE
GLUCOSE, UA: NEGATIVE mg/dL
Hgb urine dipstick: NEGATIVE
Ketones, ur: 5 mg/dL — AB
NITRITE: NEGATIVE
PH: 5 (ref 5.0–8.0)
Protein, ur: 30 mg/dL — AB
Specific Gravity, Urine: 1.032 — ABNORMAL HIGH (ref 1.005–1.030)

## 2018-03-10 LAB — COMPREHENSIVE METABOLIC PANEL
ALT: 12 U/L (ref 0–44)
AST: 17 U/L (ref 15–41)
Albumin: 4.1 g/dL (ref 3.5–5.0)
Alkaline Phosphatase: 53 U/L (ref 38–126)
Anion gap: 6 (ref 5–15)
BILIRUBIN TOTAL: 0.7 mg/dL (ref 0.3–1.2)
BUN: 12 mg/dL (ref 6–20)
CALCIUM: 8.9 mg/dL (ref 8.9–10.3)
CO2: 26 mmol/L (ref 22–32)
CREATININE: 0.82 mg/dL (ref 0.44–1.00)
Chloride: 106 mmol/L (ref 98–111)
GFR calc Af Amer: >60 mL/min (ref >60)
Glucose, Bld: 92 mg/dL (ref 70–99)
Potassium: 3.4 mmol/L — ABNORMAL LOW (ref 3.5–5.1)
Sodium: 138 mmol/L (ref 135–145)
TOTAL PROTEIN: 7.7 g/dL (ref 6.5–8.1)

## 2018-03-10 LAB — CBC
HEMATOCRIT: 34.2 % — AB (ref 35.0–47.0)
Hemoglobin: 11.7 g/dL — ABNORMAL LOW (ref 12.0–16.0)
MCH: 29.7 pg (ref 26.0–34.0)
MCHC: 34.1 g/dL (ref 32.0–36.0)
MCV: 87 fL (ref 80.0–100.0)
Platelets: 305 10*3/uL (ref 150–440)
RBC: 3.93 MIL/uL (ref 3.80–5.20)
RDW: 14.2 % (ref 11.5–14.5)
WBC: 12.5 10*3/uL — AB (ref 3.6–11.0)

## 2018-03-10 LAB — POCT PREGNANCY, URINE: Preg Test, Ur: NEGATIVE

## 2018-03-10 LAB — CK: CK TOTAL: 88 U/L (ref 38–234)

## 2018-03-10 MED ORDER — SODIUM CHLORIDE 0.9 % IV BOLUS
1000.0000 mL | Freq: Once | INTRAVENOUS | Status: AC
  Administered 2018-03-10: 1000 mL via INTRAVENOUS

## 2018-03-10 MED ORDER — OXYCODONE-ACETAMINOPHEN 5-325 MG PO TABS: 1.0000 | ORAL_TABLET | ORAL | 0 refills | Status: DC | PRN

## 2018-03-10 MED ORDER — OXYCODONE-ACETAMINOPHEN 5-325 MG PO TABS
1.0000 | ORAL_TABLET | Freq: Once | ORAL | Status: AC
  Administered 2018-03-10: 1 via ORAL
  Filled 2018-03-10: qty 1

## 2018-03-10 NOTE — ED Notes (Signed)
Urine pregnancy test result= NEGATIVE
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
## 2018-03-10 NOTE — ED Notes (Signed)
Pt returned to ED Rm 3 from MRI at this time.

## 2018-03-11 ENCOUNTER — Ambulatory Visit (INDEPENDENT_AMBULATORY_CARE_PROVIDER_SITE_OTHER): Payer: Self-pay | Admitting: Obstetrics and Gynecology

## 2018-03-11 ENCOUNTER — Other Ambulatory Visit (HOSPITAL_COMMUNITY)
Admission: RE | Admit: 2018-03-11 | Discharge: 2018-03-11 | Disposition: A | Discharge status: Home / Self Care | Payer: Self-pay | Source: Ambulatory Visit | Attending: Obstetrics and Gynecology | Admitting: Obstetrics and Gynecology

## 2018-03-11 ENCOUNTER — Encounter: Payer: Self-pay | Admitting: Obstetrics and Gynecology

## 2018-03-11 VITALS — BP 118/70 | HR 79 | Ht 67.0 in | Wt 172.0 lb

## 2018-03-11 DIAGNOSIS — Z124 Encounter for screening for malignant neoplasm of cervix: Secondary | ICD-10-CM

## 2018-03-11 DIAGNOSIS — N949 Unspecified condition associated with female genital organs and menstrual cycle: Secondary | ICD-10-CM

## 2018-03-11 DIAGNOSIS — N83202 Unspecified ovarian cyst, left side: Secondary | ICD-10-CM

## 2018-03-11 DIAGNOSIS — Z113 Encounter for screening for infections with a predominantly sexual mode of transmission: Secondary | ICD-10-CM

## 2018-03-11 DIAGNOSIS — Z1151 Encounter for screening for human papillomavirus (HPV): Secondary | ICD-10-CM | POA: Insufficient documentation

## 2018-03-11 NOTE — Progress Notes (Signed)
Patient ID: Tonya Rodriguez, female   DOB: [birth year], 27 y.o.   MRN: [id number]  Reason for Consult: Follow-up (ER f/u left ovarian cyst, Left leg numbness, 9/10 pain )   Referred by No ref. provider found  Subjective:     HPI:  Tonya Rodriguez is a 27 y.o. female . She is following up from the ER for an adnexal cyst that was seen on a MRI of her leg. She has has some pain on the left side that radiates down her leg. She does not have a history of ovarian cysts. She has regular monthly periods. She does no use any form of birth control at this time.    Records and images from the hospital reviewed.   History reviewed. No pertinent past medical history. Family History  Problem Relation Age of Onset  . Cancer Mother   . Hypertension Father    History reviewed. No pertinent surgical history.  Short Social History:  Social History   Tobacco Use  . Smoking status: Never Smoker  . Smokeless tobacco: Never Used  Substance Use Topics  . Alcohol use: Yes    No Known Allergies  Current Outpatient Medications  Medication Sig Dispense Refill  . oxyCODONE-acetaminophen (PERCOCET) 5-325 MG tablet Take 1 tablet by mouth every 4 (four) hours as needed for severe pain. 20 tablet 0  . phenazopyridine (PYRIDIUM) 200 MG tablet Take 1 tablet (200 mg total) by mouth 3 (three) times daily as needed for pain. (Patient not taking: Reported on 03/11/2018) 6 tablet 0  . sulfamethoxazole-trimethoprim (BACTRIM DS,SEPTRA DS) 800-160 MG tablet Take 1 tablet by mouth 2 (two) times daily. (Patient not taking: Reported on 03/11/2018) 20 tablet 0   No current facility-administered medications for this visit.     Review of Systems  Constitutional: Negative for chills, fatigue, fever and unexpected weight change.  HENT: Negative for trouble swallowing.  Eyes: Negative for loss of vision.  Respiratory: Negative for cough, shortness of breath and wheezing.  Cardiovascular: Negative for chest pain, leg  swelling, palpitations and syncope.  GI: Negative for abdominal pain, blood in stool, diarrhea, nausea and vomiting.  GU: Negative for difficulty urinating, dysuria, frequency and hematuria.  Musculoskeletal: Positive for leg pain. Negative for back pain and joint pain.  Skin: Negative for rash.  Neurological: Negative for dizziness, headaches, light-headedness, numbness and seizures.  Psychiatric: Negative for behavioral problem, confusion, depressed mood and sleep disturbance.        Objective:  Objective   Vitals:   03/11/18 1347  BP: 118/70  Pulse: 79  Weight: 172 lb (78 kg)  Height: 5\' 7"  (1.702 m)   Body mass index is 26.94 kg/m.  Physical Exam  Constitutional: She is oriented to person, place, and time. She appears well-developed and well-nourished.  HENT:  Head: Normocephalic and atraumatic.  Eyes: Pupils are equal, round, and reactive to light. EOM are normal.  Cardiovascular: Normal rate and regular rhythm.  Pulmonary/Chest: Effort normal. No respiratory distress.  Abdominal: Soft. She exhibits no distension. There is no tenderness. There is no guarding.  Genitourinary:  Genitourinary Comments: Uterus deviated to the right, anteverted. Left sided fullness. No adnexal tenderness. No CMT. No abnormal discharge. Normal appearing uterus.   Neurological: She is alert and oriented to person, place, and time.  Skin: Skin is warm and dry.  Psychiatric: She has a normal mood and affect. Her behavior is normal. Judgment and thought content normal.  Nursing note and vitals reviewed.  Assessment/Plan:     27 yo with left ovarian adnexal cyst. 1. Pelvic US in 1-2 weeks 2. Pap smear today with STD testing.   More than 45 minutes were spent face to face with the patient in the room with more than 50% of the time spent providing counseling and discussing the plan of management. We discussed management of ovarian cysts.    Adelene Idler MD [facility], [facility] 03/11/18 2:58 PM

## 2018-03-16 LAB — CYTOLOGY - PAP
Chlamydia: NEGATIVE
DIAGNOSIS: NEGATIVE
HPV: NOT DETECTED
NEISSERIA GONORRHEA: NEGATIVE
Trichomonas: POSITIVE — AB

## 2018-03-19 ENCOUNTER — Ambulatory Visit (INDEPENDENT_AMBULATORY_CARE_PROVIDER_SITE_OTHER): Payer: Self-pay | Admitting: Obstetrics and Gynecology

## 2018-03-19 ENCOUNTER — Encounter: Payer: Self-pay | Admitting: Obstetrics and Gynecology

## 2018-03-19 ENCOUNTER — Ambulatory Visit (INDEPENDENT_AMBULATORY_CARE_PROVIDER_SITE_OTHER): Payer: Self-pay

## 2018-03-19 VITALS — BP 114/70 | HR 67 | Ht 67.0 in | Wt 181.0 lb

## 2018-03-19 DIAGNOSIS — N83202 Unspecified ovarian cyst, left side: Secondary | ICD-10-CM

## 2018-03-19 DIAGNOSIS — A599 Trichomoniasis, unspecified: Secondary | ICD-10-CM

## 2018-03-19 DIAGNOSIS — N83201 Unspecified ovarian cyst, right side: Secondary | ICD-10-CM

## 2018-03-19 DIAGNOSIS — N949 Unspecified condition associated with female genital organs and menstrual cycle: Secondary | ICD-10-CM

## 2018-03-19 MED ORDER — METRONIDAZOLE 500 MG PO TABS: 2000.0000 mg | ORAL_TABLET | Freq: Once | ORAL | 0 refills | Status: AC

## 2018-03-19 NOTE — Progress Notes (Signed)
Patient ID: Tonya GrumblingJasmine E Stimson, female   DOB: [birth year], 27 y.o.   MRN: [id number]  Reason for Consult: Follow-up (Still having pain radiating down left leg)   Referred by No ref. provider found  Subjective:     HPI:  Tonya Rodriguez is a 27 y.o. female . She is following up today from the ER for a left adnexal mass which was seen on leg MRI. Her symptoms of pelvic pain are unchanged.   Past Medical History:  Diagnosis Date  . Adnexal cyst 03/2018   Left    Family History  Problem Relation Age of Onset  . Cancer Mother   . Hypertension Father    History reviewed. No pertinent surgical history.  Short Social History:  Social History   Tobacco Use  . Smoking status: Never Smoker  . Smokeless tobacco: Never Used  Substance Use Topics  . Alcohol use: Yes    No Known Allergies  Current Outpatient Medications  Medication Sig Dispense Refill  . oxyCODONE-acetaminophen (PERCOCET) 5-325 MG tablet Take 1 tablet by mouth every 4 (four) hours as needed for severe pain. 20 tablet 0  . metroNIDAZOLE (FLAGYL) 500 MG tablet Take 4 tablets (2,000 mg total) by mouth once for 1 dose. Take with food. 4 tablet 0  . phenazopyridine (PYRIDIUM) 200 MG tablet Take 1 tablet (200 mg total) by mouth 3 (three) times daily as needed for pain. (Patient not taking: Reported on 03/11/2018) 6 tablet 0  . sulfamethoxazole-trimethoprim (BACTRIM DS,SEPTRA DS) 800-160 MG tablet Take 1 tablet by mouth 2 (two) times daily. (Patient not taking: Reported on 03/11/2018) 20 tablet 0   No current facility-administered medications for this visit.     Review of Systems  Constitutional: Negative for chills, fatigue, fever and unexpected weight change.  HENT: Negative for trouble swallowing.  Eyes: Negative for loss of vision.  Respiratory: Negative for cough, shortness of breath and wheezing.  Cardiovascular: Negative for chest pain, leg swelling, palpitations and syncope.  GI: Negative for abdominal pain, blood in  stool, diarrhea, nausea and vomiting.  GU: Negative for difficulty urinating, dysuria, frequency and hematuria.  Musculoskeletal: Negative for back pain, leg pain and joint pain.  Skin: Negative for rash.  Neurological: Negative for dizziness, headaches, light-headedness, numbness and seizures.  Psychiatric: Negative for behavioral problem, confusion, depressed mood and sleep disturbance.        Objective:  Objective   Vitals:   03/19/18 1416  BP: 114/70  Pulse: 67  Weight: 181 lb (82.1 kg)  Height: 5\' 7"  (1.702 m)   Body mass index is 28.35 kg/m.  Physical Exam  Constitutional: She is oriented to person, place, and time. She appears well-developed and well-nourished.  HENT:  Head: Normocephalic and atraumatic.  Eyes: Pupils are equal, round, and reactive to light. EOM are normal.  Cardiovascular: Normal rate and regular rhythm.  Pulmonary/Chest: Effort normal. No respiratory distress.  Abdominal: Soft.  Neurological: She is alert and oriented to person, place, and time.  Skin: Skin is warm and dry.  Psychiatric: She has a normal mood and affect. Her behavior is normal. Judgment and thought content normal.  Nursing note and vitals reviewed.        Assessment/Plan:    27 yo with left ovarian cyst.  Large left ovarian cyst, 8cm - discussed laparscopic removal of cyst with patient. She does not have insurance at this time. Harriett Sineancy spoke to her about applying for medicaid and she was also given information about Training and development officerfinancial advisors  she could speak to at the hospital.  Discussed Trichomoniasis and treatment.   More than 30 minutes were spent face to face with the patient in the room with more than 50% of the time spent providing counseling and discussing the plan of management. We discussed her plan of treatment, follow up and how she can go about obtaining insurance.   Adelene Idlerhristanna Reshonda Koerber MD [facility], [facility] 03/19/18 2:49 PM

## 2018-04-16 ENCOUNTER — Encounter: Payer: Self-pay | Admitting: Obstetrics and Gynecology

## 2018-04-16 ENCOUNTER — Ambulatory Visit (INDEPENDENT_AMBULATORY_CARE_PROVIDER_SITE_OTHER): Payer: No Typology Code available for payment source | Admitting: Obstetrics and Gynecology

## 2018-04-16 VITALS — BP 112/62 | HR 81 | Ht 67.0 in | Wt 166.0 lb

## 2018-04-16 DIAGNOSIS — R202 Paresthesia of skin: Secondary | ICD-10-CM

## 2018-04-16 DIAGNOSIS — R200 Anesthesia of skin: Secondary | ICD-10-CM

## 2018-04-16 DIAGNOSIS — N83202 Unspecified ovarian cyst, left side: Secondary | ICD-10-CM | POA: Diagnosis not present

## 2018-04-16 NOTE — Progress Notes (Signed)
Patient ID: Tonya Rodriguez, female   DOB: 1990-09-04, 27 y.o.   MRN: [id number]  Reason for Consult: Follow-up (Not sleeping, pain in left foot not up leg )   Referred by No ref. provider found  Subjective:     HPI:  Tonya Rodriguez is a 27 y.o. female . She was last seen a month ago and we discussed surgery to remove the ovarian cyst. She has been able to obtain insurance in the last month so that she can have surgery. The cyst was found incidentally to a MRI she had of her leg. She is having pain and tingling in her left foot. She has difficulty standing because of the pain in her foot. She reports that sometimes it will go numb and she can't feel her toes. This has been a persistent problem for over a month. She is on restrictions at work and has her job modified so that she just sits.    Past Medical History:  Diagnosis Date  . Adnexal cyst 03/2018   Left    Family History  Problem Relation Age of Onset  . Cancer Mother   . Hypertension Father    History reviewed. No pertinent surgical history.  Short Social History:  Social History   Tobacco Use  . Smoking status: Never Smoker  . Smokeless tobacco: Never Used  Substance Use Topics  . Alcohol use: Yes    No Known Allergies  No current outpatient medications on file.   No current facility-administered medications for this visit.     Review of Systems  Constitutional: Negative for chills, fatigue, fever and unexpected weight change.  HENT: Negative for trouble swallowing.  Eyes: Negative for loss of vision.  Respiratory: Negative for cough, shortness of breath and wheezing.  Cardiovascular: Negative for chest pain, leg swelling, palpitations and syncope.  GI: Negative for abdominal pain, blood in stool, diarrhea, nausea and vomiting.  GU: Negative for difficulty urinating, dysuria, frequency and hematuria.  Musculoskeletal: Negative for back pain, leg pain and joint pain.  Skin: Negative for rash.  Neurological:  Negative for dizziness, headaches, light-headedness, numbness and seizures.  Psychiatric: Negative for behavioral problem, confusion, depressed mood and sleep disturbance.        Objective:  Objective   Vitals:   04/16/18 1558  BP: 112/62  Pulse: 81  Weight: 166 lb (75.3 kg)  Height: 5\' 7"  (1.702 m)   Body mass index is 26 kg/m.  Physical Exam  Constitutional: She is oriented to person, place, and time. She appears well-developed and well-nourished.  HENT:  Head: Normocephalic and atraumatic.  Eyes: Pupils are equal, round, and reactive to light. EOM are normal.  Cardiovascular: Normal rate and regular rhythm.  Pulses:      Dorsalis pedis pulses are 2+ on the left side.       Posterior tibial pulses are 2+ on the left side.  Pulmonary/Chest: Effort normal. No respiratory distress.  Abdominal: Soft. She exhibits no distension.  Musculoskeletal:       Left foot: There is normal range of motion and no deformity.  Feet:  Left Foot:  Protective Sensation: 3 sites tested. 3 sites sensed.  Skin Integrity: Positive for callus and dry skin. Negative for ulcer, blister, skin breakdown or erythema.  Neurological: She is alert and oriented to person, place, and time.  Skin: Skin is warm and dry.  Psychiatric: She has a normal mood and affect. Her behavior is normal. Judgment and thought content normal.  Nursing  note and vitals reviewed.  Bedside US showed the left ovary to be 4cm in diameter. 8cm ovarian cyst has resolved.        Assessment/Plan:    27 yo following up for a left ovarian cyst.  Ovarian cyst has resolved on bedside ultrasound performed in office by myself.   Recommended seeing podiatry for her foot and if they are not able to help with answers seeing Neurology. Will refer to podiatry and neurology for further evaluation.   More than 25 minutes were spent face to face with the patient in the room with more than 50% of the time spent providing counseling and  discussing the plan of management.    Adelene Idlerhristanna Schuman MD [facility] 04/16/18 4:25 PM

## 2019-04-27 ENCOUNTER — Emergency Department
Admission: EM | Admit: 2019-04-27 | Discharge: 2019-04-27 | Disposition: A | Discharge status: Home / Self Care | Payer: Self-pay | Attending: Emergency Medicine | Admitting: Emergency Medicine

## 2019-04-27 ENCOUNTER — Other Ambulatory Visit: Payer: Self-pay

## 2019-04-27 ENCOUNTER — Encounter: Payer: Self-pay | Admitting: Emergency Medicine

## 2019-04-27 DIAGNOSIS — J020 Streptococcal pharyngitis: Secondary | ICD-10-CM | POA: Insufficient documentation

## 2019-04-27 LAB — GROUP A STREP BY PCR: Group A Strep by PCR: DETECTED — AB

## 2019-04-27 MED ORDER — AMOXICILLIN 875 MG PO TABS: 875.0000 mg | ORAL_TABLET | Freq: Two times a day (BID) | ORAL | 0 refills | Status: DC

## 2019-04-27 NOTE — ED Triage Notes (Signed)
Pt here with c/o bilateral ear pain and sore throat that began yesterday. NAD.

## 2019-04-27 NOTE — ED Provider Notes (Signed)
[facility] Emergency Department Provider Note   ____________________________________________   First MD Initiated Contact with Patient 04/27/19 0940     (approximate)  I have reviewed the triage vital signs and the nursing notes.   HISTORY  Chief Complaint Otalgia and Sore Throat   HPI Tonya Rodriguez is a 28 y.o. female presents to the ED with complaint of bilateral ear pain and throat pain that began yesterday.  Patient is unaware of any fever and denies chills.  She also denies any symptoms that are COVID related and no exposure.  Patient states pain is worse with swallowing.  She rates her pain as a 9/10.      Past Medical History:  Diagnosis Date  . Adnexal cyst 03/2018   Left     There are no active problems to display for this patient.   History reviewed. No pertinent surgical history.  Prior to Admission medications   Medication Sig Start Date End Date Taking? Authorizing Provider  amoxicillin (AMOXIL) 875 MG tablet Take 1 tablet (875 mg total) by mouth 2 (two) times daily. 04/27/19   Tommi Rumps, PA-C    Allergies Patient has no known allergies.  Family History  Problem Relation Age of Onset  . Cancer Mother   . Hypertension Father     Social History Social History   Tobacco Use  . Smoking status: Never Smoker  . Smokeless tobacco: Never Used  Substance Use Topics  . Alcohol use: Yes  . Drug use: No    Review of Systems Constitutional: No fever/chills Eyes: No visual changes. ENT: Positive sore throat.  Positive bilateral ear pain. Cardiovascular: Denies chest pain. Respiratory: Denies shortness of breath.  Negative for cough. Gastrointestinal: No abdominal pain.  No nausea, no vomiting.  Genitourinary: Negative for dysuria. Musculoskeletal: Negative for muscle aches. Neurological: Negative for headaches, focal weakness or numbness. ___________________________________________   PHYSICAL EXAM:  VITAL  SIGNS: ED Triage Vitals  Enc Vitals Group     BP 04/27/19 0932 121/75     Pulse Rate 04/27/19 0932 73     Resp 04/27/19 0932 16     Temp 04/27/19 0932 98.2 F (36.8 C)     Temp Source 04/27/19 0932 Oral     SpO2 04/27/19 0932 100 %     Weight 04/27/19 0930 168 lb (76.2 kg)     Height 04/27/19 0930 5\' 7"  (1.702 m)     Head Circumference --      Peak Flow --      Pain Score 04/27/19 0930 9     Pain Loc --      Pain Edu? --      Excl. in GC? --    Constitutional: Alert and oriented. Well appearing and in no acute distress. Eyes: Conjunctivae are normal.  Head: Atraumatic. Nose: No congestion/rhinnorhea.  EACs and TMs are clear bilaterally. Mouth/Throat: Mucous membranes are moist.  Oropharynx mild erythema.  Minimal tonsillar exudate more to the right than on the left.  Uvula is midline. Neck: No stridor.   Hematological/Lymphatic/Immunilogical: No cervical lymphadenopathy. Cardiovascular: Normal rate, regular rhythm. Grossly normal heart sounds.  Good peripheral circulation. Respiratory: Normal respiratory effort.  No retractions. Lungs CTAB. Musculoskeletal: No lower extremity tenderness nor edema.  No joint effusions. Neurologic:  Normal speech and language. No gross focal neurologic deficits are appreciated. No gait instability. Skin:  Skin is warm, dry and intact. No rash noted. Psychiatric: Mood and affect are normal. Speech and behavior are  normal.  ____________________________________________   LABS (all labs ordered are listed, but only abnormal results are displayed)  Labs Reviewed  GROUP A STREP BY PCR - Abnormal; Notable for the following components:      Result Value   Group A Strep by PCR DETECTED (*)    All other components within normal limits    PROCEDURES  Procedure(s) performed (including Critical Care):  Procedures   ____________________________________________   INITIAL IMPRESSION / ASSESSMENT AND PLAN / ED COURSE  As part of my medical  decision making, I reviewed the following data within the electronic MEDICAL RECORD NUMBER Notes from prior ED visits and [place] Controlled Substance Database  TEWANA BOHLEN was evaluated in Emergency Department on 04/27/2019 for the symptoms described in the history of present illness. She was evaluated in the context of the global COVID-19 pandemic, which necessitated consideration that the patient might be at risk for infection with the SARS-CoV-2 virus that causes COVID-19. Institutional protocols and algorithms that pertain to the evaluation of patients at risk for COVID-19 are in a state of rapid change based on information released by regulatory bodies including the CDC and federal and state organizations. These policies and algorithms were followed during the patient's care in the ED.  28 year old female presents to the ED with complaint of bilateral ear pain and sore throat that began yesterday.  Patient is unaware of any fever.  She denies any known exposure to COVID.  On exam right tonsil is erythematous with exudate.  Strep test was negative.  Patient was placed on amoxicillin 875 mg twice a day for the next 10 days.  Patient is aware that she is contagious for the next 24 hours.  She is encouraged to take Tylenol or ibuprofen as needed for pain.  She is to follow-up with her PCP if any continued problems.  ____________________________________________   FINAL CLINICAL IMPRESSION(S) / ED DIAGNOSES  Final diagnoses:  Strep pharyngitis     ED Discharge Orders         Ordered    amoxicillin (AMOXIL) 875 MG tablet  2 times daily     04/27/19 1129           Note:  This document was prepared using Dragon voice recognition software and may include unintentional dictation errors.    Johnn Hai, PA-C 04/27/19 1301    Carrie Mew, MD 04/29/19 2025

## 2019-04-27 NOTE — ED Notes (Signed)
Lab called to inquire about strep test sent via tube system.

## 2019-04-27 NOTE — Discharge Instructions (Signed)
Follow-up with your primary care provider or [facility] if any continued problems.  You may take Tylenol or ibuprofen along with the antibiotic if you need for pain.  Increase fluids.  Begin taking amoxicillin 875 twice daily until completely finished.  You are contagious for 24 hours.  Take all of the antibiotic as you will be returning with a sore throat if he only take half of it.

## 2019-06-23 ENCOUNTER — Telehealth: Payer: Self-pay | Admitting: Licensed Clinical Social Worker

## 2019-06-29 NOTE — Telephone Encounter (Signed)
Attempted to reach Tonya Rodriguez regarding a genetic counseling appointment scheduled for her on 11/30 at 11 am.

## 2019-07-04 ENCOUNTER — Inpatient Hospital Stay: Payer: Medicaid Other | Attending: Oncology | Admitting: Licensed Clinical Social Worker

## 2019-11-14 ENCOUNTER — Ambulatory Visit: Payer: No Typology Code available for payment source | Admitting: Obstetrics & Gynecology

## 2020-07-16 ENCOUNTER — Emergency Department: Payer: Self-pay

## 2020-07-16 ENCOUNTER — Other Ambulatory Visit: Payer: Self-pay

## 2020-07-16 ENCOUNTER — Encounter: Payer: Self-pay | Admitting: Emergency Medicine

## 2020-07-16 ENCOUNTER — Emergency Department
Admission: EM | Admit: 2020-07-16 | Discharge: 2020-07-16 | Disposition: A | Discharge status: Home / Self Care | Payer: Self-pay | Attending: Emergency Medicine | Admitting: Emergency Medicine

## 2020-07-16 DIAGNOSIS — A64 Unspecified sexually transmitted disease: Secondary | ICD-10-CM

## 2020-07-16 DIAGNOSIS — R1032 Left lower quadrant pain: Secondary | ICD-10-CM

## 2020-07-16 DIAGNOSIS — Z202 Contact with and (suspected) exposure to infections with a predominantly sexual mode of transmission: Secondary | ICD-10-CM | POA: Insufficient documentation

## 2020-07-16 DIAGNOSIS — N760 Acute vaginitis: Secondary | ICD-10-CM | POA: Insufficient documentation

## 2020-07-16 DIAGNOSIS — A5901 Trichomonal vulvovaginitis: Secondary | ICD-10-CM | POA: Insufficient documentation

## 2020-07-16 DIAGNOSIS — B9689 Other specified bacterial agents as the cause of diseases classified elsewhere: Secondary | ICD-10-CM | POA: Insufficient documentation

## 2020-07-16 LAB — CHLAMYDIA/NGC RT PCR (ARMC ONLY)
Chlamydia Tr: NOT DETECTED
N gonorrhoeae: NOT DETECTED

## 2020-07-16 LAB — LIPASE, BLOOD: Lipase: 19 U/L (ref 11–51)

## 2020-07-16 LAB — URINALYSIS, COMPLETE (UACMP) WITH MICROSCOPIC
Bacteria, UA: NONE SEEN
Bilirubin Urine: NEGATIVE
Glucose, UA: NEGATIVE mg/dL
Hgb urine dipstick: NEGATIVE
Ketones, ur: NEGATIVE mg/dL
Leukocytes,Ua: NEGATIVE
Nitrite: NEGATIVE
Protein, ur: NEGATIVE mg/dL
Specific Gravity, Urine: 1.014 (ref 1.005–1.030)
pH: 6 (ref 5.0–8.0)

## 2020-07-16 LAB — CBC
HCT: 35.7 % — ABNORMAL LOW (ref 36.0–46.0)
Hemoglobin: 12.4 g/dL (ref 12.0–15.0)
MCH: 28.8 pg (ref 26.0–34.0)
MCHC: 34.7 g/dL (ref 30.0–36.0)
MCV: 83 fL (ref 80.0–100.0)
Platelets: 363 10*3/uL (ref 150–400)
RBC: 4.3 MIL/uL (ref 3.87–5.11)
RDW: 15.1 % (ref 11.5–15.5)
WBC: 8.9 10*3/uL (ref 4.0–10.5)
nRBC: 0 % (ref 0.0–0.2)

## 2020-07-16 LAB — COMPREHENSIVE METABOLIC PANEL
ALT: 20 U/L (ref 0–44)
AST: 22 U/L (ref 15–41)
Albumin: 4.1 g/dL (ref 3.5–5.0)
Alkaline Phosphatase: 57 U/L (ref 38–126)
Anion gap: 10 (ref 5–15)
BUN: 8 mg/dL (ref 6–20)
CO2: 26 mmol/L (ref 22–32)
Calcium: 9.2 mg/dL (ref 8.9–10.3)
Chloride: 104 mmol/L (ref 98–111)
Creatinine, Ser: 0.78 mg/dL (ref 0.44–1.00)
GFR, Estimated: >60 mL/min (ref >60)
Glucose, Bld: 95 mg/dL (ref 70–99)
Potassium: 3.5 mmol/L (ref 3.5–5.1)
Sodium: 140 mmol/L (ref 135–145)
Total Bilirubin: 1.1 mg/dL (ref 0.3–1.2)
Total Protein: 8.4 g/dL — ABNORMAL HIGH (ref 6.5–8.1)

## 2020-07-16 LAB — WET PREP, GENITAL
Sperm: NONE SEEN
Yeast Wet Prep HPF POC: NONE SEEN

## 2020-07-16 LAB — POC URINE PREG, ED: Preg Test, Ur: NEGATIVE

## 2020-07-16 MED ORDER — CEFTRIAXONE SODIUM 250 MG IJ SOLR
250.0000 mg | Freq: Once | INTRAMUSCULAR | Status: AC
  Administered 2020-07-16: 250 mg via INTRAMUSCULAR
  Filled 2020-07-16: qty 250

## 2020-07-16 MED ORDER — AZITHROMYCIN 500 MG PO TABS
1000.0000 mg | ORAL_TABLET | Freq: Once | ORAL | Status: AC
  Administered 2020-07-16: 1000 mg via ORAL
  Filled 2020-07-16: qty 2

## 2020-07-16 MED ORDER — METRONIDAZOLE 500 MG PO TABS: 500.0000 mg | ORAL_TABLET | Freq: Two times a day (BID) | ORAL | 0 refills | Status: AC

## 2020-07-16 NOTE — ED Triage Notes (Signed)
Pt via pov from home with LLQ pain since last night. Pt states she has had intermittent pain similar in nature, but that it has not happened in a while. Pt reports hx of ovarian cyst. Pt also reports vaginal discharge. Denies urinary symptoms. Pt alert & oriented, NAD noted.

## 2020-07-16 NOTE — Discharge Instructions (Addendum)
Follow-up with the [facility] department for HIV and syphilis testing.  Or your regular doctor could order these tests. All partners need to be tested for trichomoniasis.  If your gonorrhea/chlamydia test returns is positive they will also need to be treated for this disease 2. Take the medication as prescribed Refrain from sex for at least 10 days after treatment.  Refrain from sex until your partner has been treated for at least 10 days also. Use condoms to prevent transmission of STDs.

## 2020-07-16 NOTE — ED Provider Notes (Signed)
[facility] Emergency Department Provider Note  ____________________________________________   Event Date/Time   First MD Initiated Contact with Patient 07/16/20 1542     (approximate)  I have reviewed the triage vital signs and the nursing notes.   HISTORY  Chief Complaint Vaginal Bleeding and Abdominal Pain    HPI Tonya Rodriguez is a 29 y.o. female presents emergency department complaint of left lower quadrant pain along with vaginal discharge.  States not normal for her to have vaginal discharge.  Has a history of ovarian cyst.  Denies any fever or chills.  States she has unprotected sex but only with 1 partner.    Past Medical History:  Diagnosis Date  . Adnexal cyst 03/2018   Left     There are no problems to display for this patient.   History reviewed. No pertinent surgical history.  Prior to Admission medications   Medication Sig Start Date End Date Taking? Authorizing Provider  amoxicillin (AMOXIL) 875 MG tablet Take 1 tablet (875 mg total) by mouth 2 (two) times daily. 04/27/19   Tommi Rumps, PA-C  metroNIDAZOLE (FLAGYL) 500 MG tablet Take 1 tablet (500 mg total) by mouth 2 (two) times daily for 7 days. 07/16/20 07/23/20  Faythe Ghee, PA-C    Allergies Patient has no known allergies.  Family History  Problem Relation Age of Onset  . Cancer Mother   . Hypertension Father     Social History Social History   Tobacco Use  . Smoking status: Never Smoker  . Smokeless tobacco: Never Used  Vaping Use  . Vaping Use: Never used  Substance Use Topics  . Alcohol use: Yes  . Drug use: No    Review of Systems  Constitutional: No fever/chills Eyes: No visual changes. ENT: No sore throat. Respiratory: Denies cough Cardiovascular: Denies chest pain Gastrointestinal: Positive left lower quadrant abdominal pain Genitourinary: Negative for dysuria.  Positive vaginal discharge Musculoskeletal: Negative for back pain. Skin:  Negative for rash. Psychiatric: no mood changes,     ____________________________________________   PHYSICAL EXAM:  VITAL SIGNS: ED Triage Vitals  Enc Vitals Group     BP 07/16/20 1139 140/78     Pulse Rate 07/16/20 1139 86     Resp 07/16/20 1139 17     Temp 07/16/20 1139 98.3 F (36.8 C)     Temp Source 07/16/20 1139 Oral     SpO2 07/16/20 1139 100 %     Weight 07/16/20 1147 185 lb (83.9 kg)     Height 07/16/20 1147 5\' 7"  (1.702 m)     Head Circumference --      Peak Flow --      Pain Score 07/16/20 1500 0     Pain Loc --      Pain Edu? --      Excl. in GC? --     Constitutional: Alert and oriented. Well appearing and in no acute distress. Eyes: Conjunctivae are normal.  Head: Atraumatic. Nose: No congestion/rhinnorhea. Mouth/Throat: Mucous membranes are moist.   Neck:  supple no lymphadenopathy noted Cardiovascular: Normal rate, regular rhythm.  Respiratory: Normal respiratory effort.  No retractions,  Abd: soft and LLQ tender bs normal all 4 quad GU: deferred Musculoskeletal: FROM all extremities, warm and well perfused Neurologic:  Normal speech and language.  Skin:  Skin is warm, dry and intact. No rash noted. Psychiatric: Mood and affect are normal. Speech and behavior are normal.  ____________________________________________   LABS (all labs ordered  are listed, but only abnormal results are displayed)  Labs Reviewed  WET PREP, GENITAL - Abnormal; Notable for the following components:      Result Value   Trich, Wet Prep PRESENT (*)    Clue Cells Wet Prep HPF POC PRESENT (*)    WBC, Wet Prep HPF POC MODERATE (*)    All other components within normal limits  COMPREHENSIVE METABOLIC PANEL - Abnormal; Notable for the following components:   Total Protein 8.4 (*)    All other components within normal limits  CBC - Abnormal; Notable for the following components:   HCT 35.7 (*)    All other components within normal limits  URINALYSIS, COMPLETE (UACMP)  WITH MICROSCOPIC - Abnormal; Notable for the following components:   Color, Urine YELLOW (*)    APPearance CLEAR (*)    All other components within normal limits  CHLAMYDIA/NGC RT PCR ([facility] ONLY)  LIPASE, BLOOD  POC URINE PREG, ED   ____________________________________________   ____________________________________________  RADIOLOGY  Ultrasound for torsion and complete pelvic  ____________________________________________   PROCEDURES  Procedure(s) performed: No  Procedures    ____________________________________________   INITIAL IMPRESSION / ASSESSMENT AND PLAN / ED COURSE  Pertinent labs & imaging results that were available during my care of the patient were reviewed by me and considered in my medical decision making (see chart for details).   Patient is 29 year old female presents with vaginal discharge and left lower quadrant pain.  See HPI.  Physical exam shows patient appears stable.  Vitals are normal.  Left lower quadrant is minimally tender.  Vaginal discharge noted on upon exam and swab sent for wet prep and GC/chlamydia.  Ultrasound is pending  DDX: STD, BV, ovarian cyst, hemorrhagic ovarian cyst, abdominal pain    Labs are normal and reassuring, CBC, metabolic panel and urinalysis are normal, lipase normal, POC pregnancy is negative  However wet prep does show trichomoniasis, clue cells, and moderate WBCs  [place] and gc/chlam pending  Ultrasound is normal  Patient was empirically treated for gonorrhea/chlamydia.  She was given Rocephin 250 mg IM, Zithromax 1 g p.o.  Prescription for metronidazole 500 twice daily for 7 days.  This is adequate to cover bacterial vaginosis and trichomoniasis.  All lab results were discussed with patient.  She is never partner and other partners treated prior to returning to sexual activity.  She should refrain from sexual activity for at least 10 days following treatment.  Explained to her that if she has sex with her  partner prior to him being treated they would just pass the disease back-and-forth.  She is to follow with [facility] department for syphilis and HIV testing.  She was discharged stable condition.    Tonya Rodriguez was evaluated in Emergency Department on 07/16/2020 for the symptoms described in the history of present illness. She was evaluated in the context of the global COVID-19 pandemic, which necessitated consideration that the patient might be at risk for infection with the SARS-CoV-2 virus that causes COVID-19. Institutional protocols and algorithms that pertain to the evaluation of patients at risk for COVID-19 are in a state of rapid change based on information released by regulatory bodies including the CDC and federal and state organizations. These policies and algorithms were followed during the patient's care in the ED.    As part of my medical decision making, I reviewed the following data within the electronic MEDICAL RECORD NUMBER Nursing notes reviewed and incorporated, Labs reviewed , Old chart reviewed,  Notes from prior ED visits and [place] Controlled Substance Database  ____________________________________________   FINAL CLINICAL IMPRESSION(S) / ED DIAGNOSES  Final diagnoses:  LLQ pain  BV (bacterial vaginosis)  Trichomoniasis of vagina  STD (female)      NEW MEDICATIONS STARTED DURING THIS VISIT:  New Prescriptions   METRONIDAZOLE (FLAGYL) 500 MG TABLET    Take 1 tablet (500 mg total) by mouth 2 (two) times daily for 7 days.     Note:  This document was prepared using Dragon voice recognition software and may include unintentional dictation errors.    Faythe Ghee, PA-C 07/16/20 1749    Arnaldo Natal, MD 07/16/20 2138

## 2020-07-16 NOTE — ED Triage Notes (Signed)
First Nurse Note:  C/O left lower quadrant pain.  States has history of ovarian cyst.  Also c/o vaginal discharge.  AAOx3.  Skin warm and dry NAD

## 2021-02-26 ENCOUNTER — Encounter: Payer: Self-pay | Admitting: Intensive Care

## 2021-02-26 ENCOUNTER — Emergency Department: Payer: Medicaid Other

## 2021-02-26 ENCOUNTER — Emergency Department
Admission: EM | Admit: 2021-02-26 | Discharge: 2021-02-26 | Disposition: A | Discharge status: Home / Self Care | Payer: Medicaid Other | Attending: Emergency Medicine | Admitting: Emergency Medicine

## 2021-02-26 ENCOUNTER — Other Ambulatory Visit: Payer: Self-pay

## 2021-02-26 DIAGNOSIS — M25561 Pain in right knee: Secondary | ICD-10-CM | POA: Insufficient documentation

## 2021-02-26 MED ORDER — NAPROXEN 500 MG PO TABS: 500.0000 mg | ORAL_TABLET | Freq: Two times a day (BID) | ORAL | 0 refills | Status: DC

## 2021-02-26 NOTE — ED Triage Notes (Signed)
Pt c/o right knee pain with swelling since yesterday, denies injury. States she is on her feet a lot at work

## 2021-02-26 NOTE — Discharge Instructions (Addendum)
Follow-up with Dr. Rosita Kea who is the orthopedist on-call if any continued problems or not improving.  Begin taking naproxen 500 mg twice daily with food.  Wear knee immobilizer when you are up walking.  Do not have to wear this while you are sleeping.  You should plan on wearing this for 1 to 2 weeks to help with your knee pain.

## 2021-02-26 NOTE — ED Provider Notes (Signed)
[facility] Emergency Department Provider Note  ____________________________________________   Event Date/Time   First MD Initiated Contact with Patient 02/26/21 [phone]     (approximate)  I have reviewed the triage vital signs and the nursing notes.   HISTORY  Chief Complaint Knee Pain   HPI Tonya Rodriguez is a 30 y.o. female presents to the ED with complaint of right knee pain that started yesterday.  Patient states that she is on her feet a lot at work but denies any recent injury.  She has not taken any over-the-counter medication for her knee pain.  She continues to ambulate without assistance.  Currently she rates her pain as an 8 out of 10.       Past Medical History:  Diagnosis Date   Adnexal cyst 03/2018   Left     There are no problems to display for this patient.   History reviewed. No pertinent surgical history.  Prior to Admission medications   Medication Sig Start Date End Date Taking? Authorizing Provider  naproxen (NAPROSYN) 500 MG tablet Take 1 tablet (500 mg total) by mouth 2 (two) times daily with a meal. 02/26/21  Yes Tommi Rumps, PA-C    Allergies Patient has no known allergies.  Family History  Problem Relation Age of Onset   Cancer Mother    Hypertension Father     Social History Social History   Tobacco Use   Smoking status: Never   Smokeless tobacco: Never  Vaping Use   Vaping Use: Never used  Substance Use Topics   Alcohol use: Yes   Drug use: Yes    Types: Marijuana    Review of Systems Constitutional: No fever/chills Cardiovascular: Denies chest pain. Respiratory: Denies shortness of breath. Gastrointestinal: No abdominal pain.  Negative for gastric disease.  No nausea, no vomiting.  Musculoskeletal: Positive right knee pain. Skin: Negative for rash. Neurological: Negative for headaches, focal weakness or numbness. ____________________________________________   PHYSICAL EXAM:  VITAL  SIGNS: ED Triage Vitals  Enc Vitals Group     BP 02/26/21 0920 131/90     Pulse Rate 02/26/21 0920 68     Resp 02/26/21 0920 16     Temp 02/26/21 0920 98.6 F (37 C)     Temp Source 02/26/21 0920 Oral     SpO2 02/26/21 0920 100 %     Weight 02/26/21 0921 185 lb (83.9 kg)     Height 02/26/21 0921 5\' 7"  (1.702 m)     Head Circumference --      Peak Flow --      Pain Score 02/26/21 0921 8     Pain Loc --      Pain Edu? --      Excl. in GC? --     Constitutional: Alert and oriented. Well appearing and in no acute distress. Eyes: Conjunctivae are normal.  Head: Atraumatic. Neck: No stridor.   Cardiovascular: Normal rate, regular rhythm. Grossly normal heart sounds.  Good peripheral circulation. Respiratory: Normal respiratory effort.  No retractions. Lungs CTAB. Gastrointestinal: Soft and nontender. No distention.  Musculoskeletal: Examination of the right knee there is no gross deformity and no soft tissue edema.  There is generalized tenderness anteriorly.  No effusion appreciated.  Range of motion is without crepitus.  Skin is intact and without warmth or erythema. Neurologic:  Normal speech and language. No gross focal neurologic deficits are appreciated.  Skin:  Skin is warm, dry and intact. No rash noted. Psychiatric:  Mood and affect are normal. Speech and behavior are normal.  ____________________________________________   LABS (all labs ordered are listed, but only abnormal results are displayed)  Labs Reviewed - No data to display ____________________________________________  ____________________________________________  RADIOLOGY Beaulah Corin, personally viewed and evaluated these images (plain radiographs) as part of my medical decision making, as well as reviewing the written report by the radiologist.   Official radiology report(s): DG Knee Complete 4 Views Right  Result Date: 02/26/2021 CLINICAL DATA:  Right knee pain, swelling EXAM: RIGHT KNEE -  COMPLETE 4+ VIEW COMPARISON:  None. FINDINGS: No evidence of fracture, dislocation, or joint effusion. No evidence of arthropathy or other focal bone abnormality. Soft tissues are unremarkable. IMPRESSION: Negative. Electronically Signed   By: Charlett Nose M.D.   On: 02/26/2021 10:10    ____________________________________________   PROCEDURES  Procedure(s) performed (including Critical Care):  Procedures   ____________________________________________   INITIAL IMPRESSION / ASSESSMENT AND PLAN / ED COURSE  As part of my medical decision making, I reviewed the following data within the electronic MEDICAL RECORD NUMBER Notes from prior ED visits and [place] Controlled Substance Database  30 year old female presents to the ED with complaint of right knee pain since yesterday.  There is been no history of injury.  Patient has not take any over-the-counter medication for her knee pain.  Physical exam is benign and x-rays are negative for any acute bony changes.  Patient was placed in a knee immobilizer and a prescription for naproxen 500 mg was sent to her pharmacy to take twice daily.  She is to follow-up with orthopedics if any continued problems.  ____________________________________________   FINAL CLINICAL IMPRESSION(S) / ED DIAGNOSES  Final diagnoses:  Acute pain of right knee     ED Discharge Orders          Ordered    naproxen (NAPROSYN) 500 MG tablet  2 times daily with meals        02/26/21 1057             Note:  This document was prepared using Dragon voice recognition software and may include unintentional dictation errors.    Tommi Rumps, PA-C 02/26/21 1125    Dionne Bucy, MD 02/26/21 1601

## 2021-02-26 NOTE — ED Notes (Signed)
See triage note  Presents with right knee w/o injury  States she noticed some swelling to knee  No deformity noted

## 2022-01-26 ENCOUNTER — Other Ambulatory Visit: Payer: Self-pay

## 2022-01-26 ENCOUNTER — Emergency Department
Admission: EM | Admit: 2022-01-26 | Discharge: 2022-01-26 | Disposition: A | Discharge status: Home / Self Care | Payer: Medicaid Other | Attending: Emergency Medicine | Admitting: Emergency Medicine

## 2022-01-26 ENCOUNTER — Encounter: Payer: Self-pay | Admitting: Emergency Medicine

## 2022-01-26 DIAGNOSIS — T782XXA Anaphylactic shock, unspecified, initial encounter: Secondary | ICD-10-CM | POA: Insufficient documentation

## 2022-01-26 MED ORDER — EPINEPHRINE 0.3 MG/0.3ML IJ SOAJ
0.3000 mg | Freq: Once | INTRAMUSCULAR | Status: AC
  Administered 2022-01-26: 0.3 mg via INTRAMUSCULAR
  Filled 2022-01-26: qty 0.3

## 2022-01-26 MED ORDER — METHYLPREDNISOLONE SODIUM SUCC 125 MG IJ SOLR
125.0000 mg | Freq: Once | INTRAMUSCULAR | Status: AC
  Administered 2022-01-26: 125 mg via INTRAVENOUS
  Filled 2022-01-26: qty 2

## 2022-01-26 MED ORDER — PREDNISONE 20 MG PO TABS: 40.0000 mg | ORAL_TABLET | Freq: Every day | ORAL | 0 refills | Status: AC

## 2022-01-26 MED ORDER — FAMOTIDINE IN NACL 20-0.9 MG/50ML-% IV SOLN
20.0000 mg | Freq: Once | INTRAVENOUS | Status: AC
  Administered 2022-01-26: 20 mg via INTRAVENOUS
  Filled 2022-01-26: qty 50

## 2022-01-26 MED ORDER — ALUM & MAG HYDROXIDE-SIMETH 200-200-20 MG/5ML PO SUSP
30.0000 mL | Freq: Once | ORAL | Status: AC
  Administered 2022-01-26: 30 mL via ORAL
  Filled 2022-01-26: qty 30

## 2022-01-26 MED ORDER — EPINEPHRINE 0.3 MG/0.3ML IJ SOAJ: 0.3000 mg | INTRAMUSCULAR | 1 refills | Status: AC | PRN

## 2022-01-26 MED ORDER — LIDOCAINE VISCOUS HCL 2 % MT SOLN
15.0000 mL | Freq: Once | OROMUCOSAL | Status: AC
  Administered 2022-01-26: 15 mL via ORAL
  Filled 2022-01-26: qty 15

## 2023-03-09 IMAGING — DX DG KNEE COMPLETE 4+V*R*
4 series · 4 of 4 positions shown · non-contrast
Comparison: None.

CLINICAL DATA: Right knee pain, swelling

EXAM:
RIGHT KNEE - COMPLETE 4+ VIEW

[knee ap]
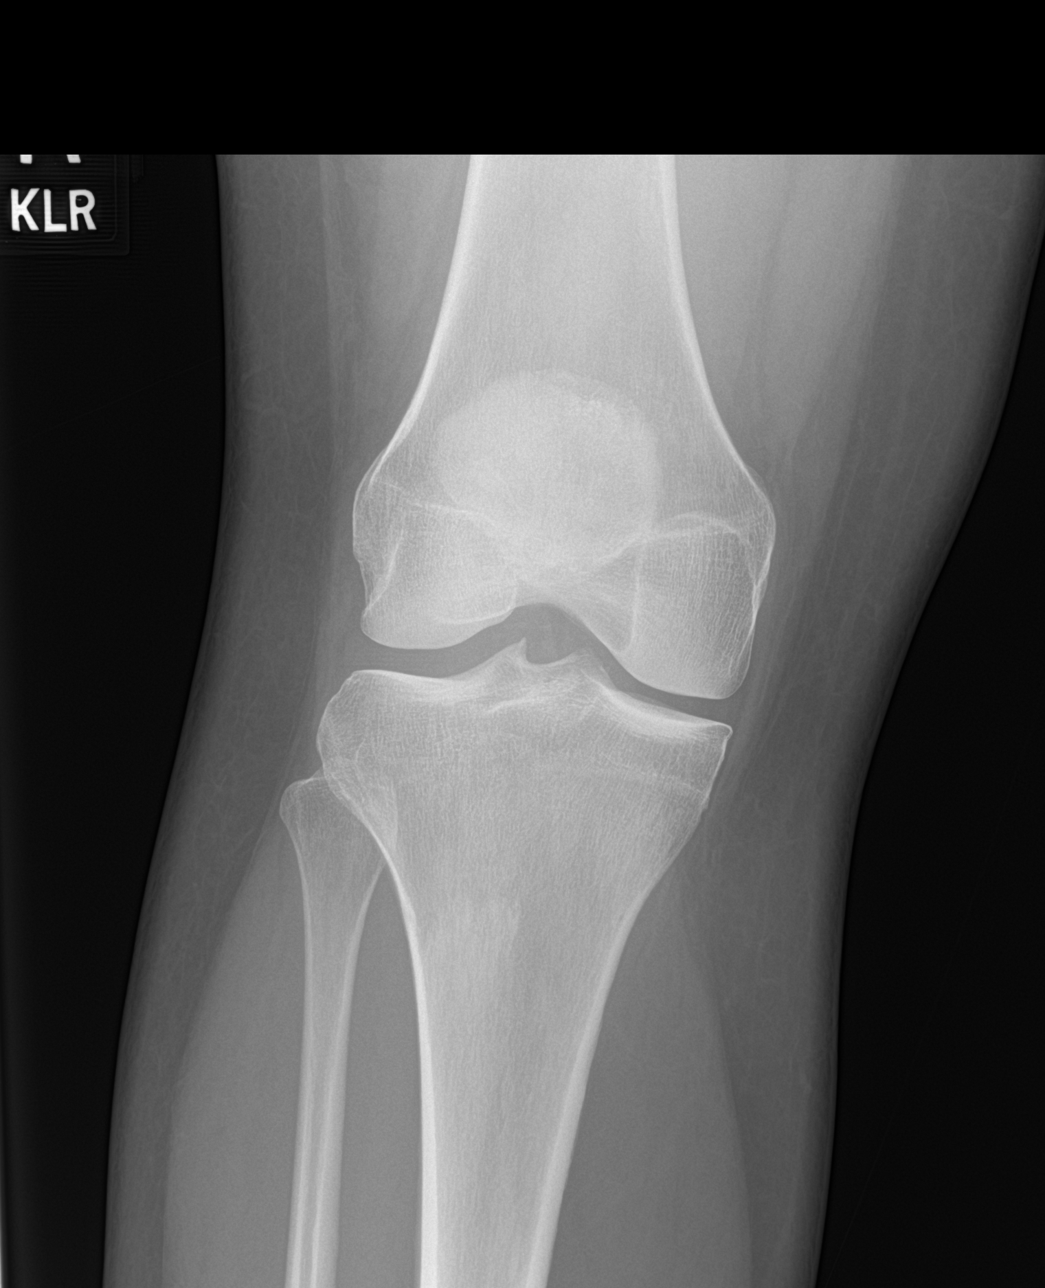

[knee lat]
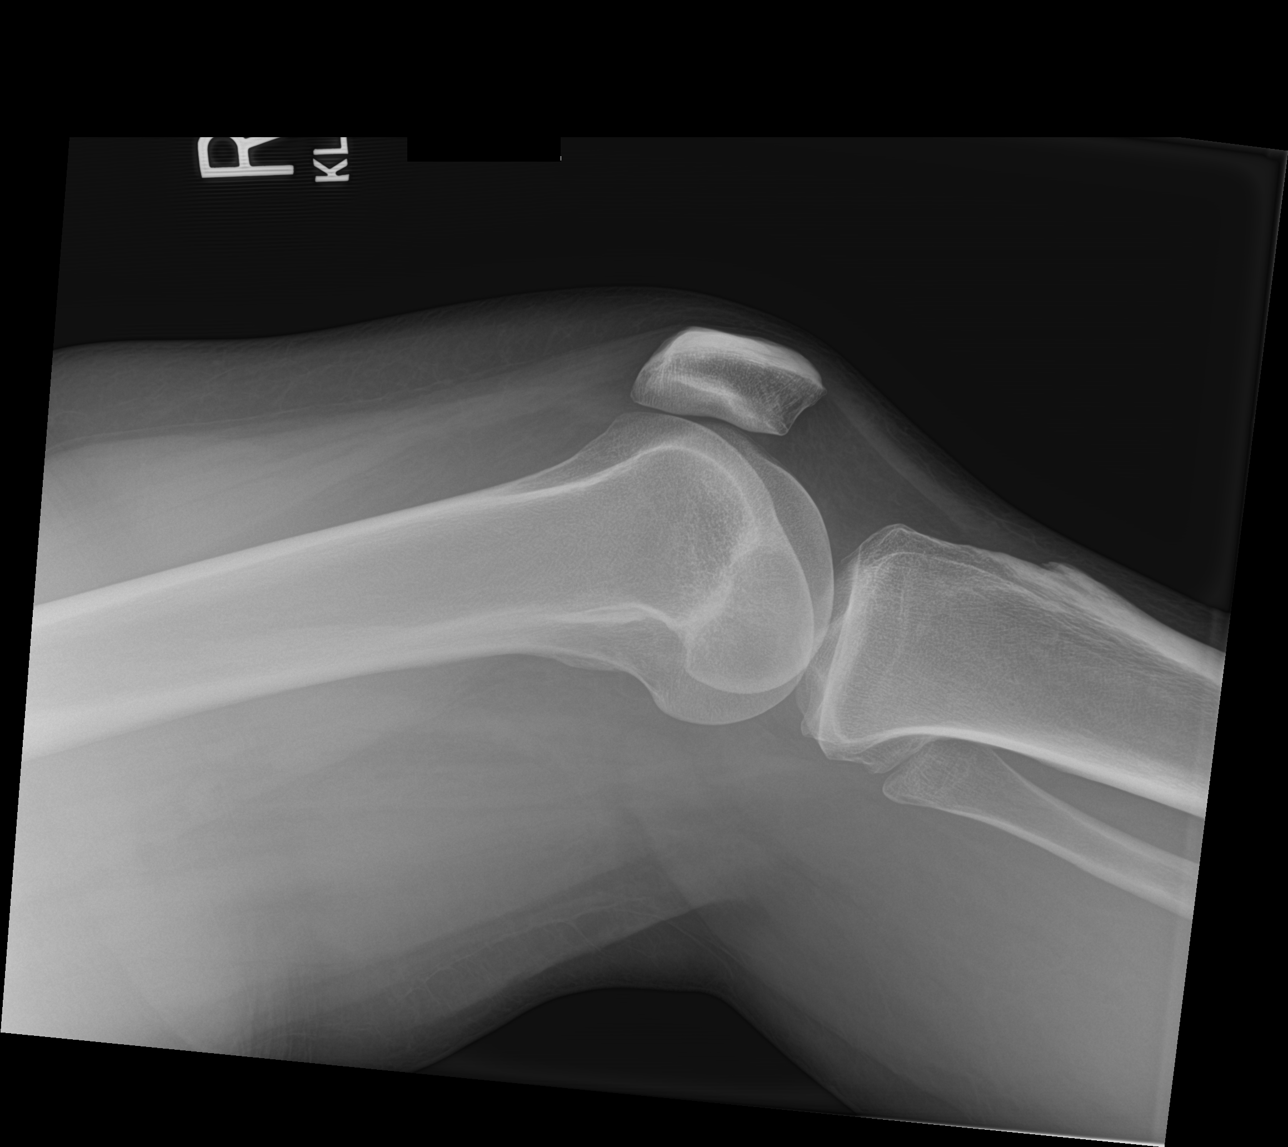

[knee obl (1 of 2)]
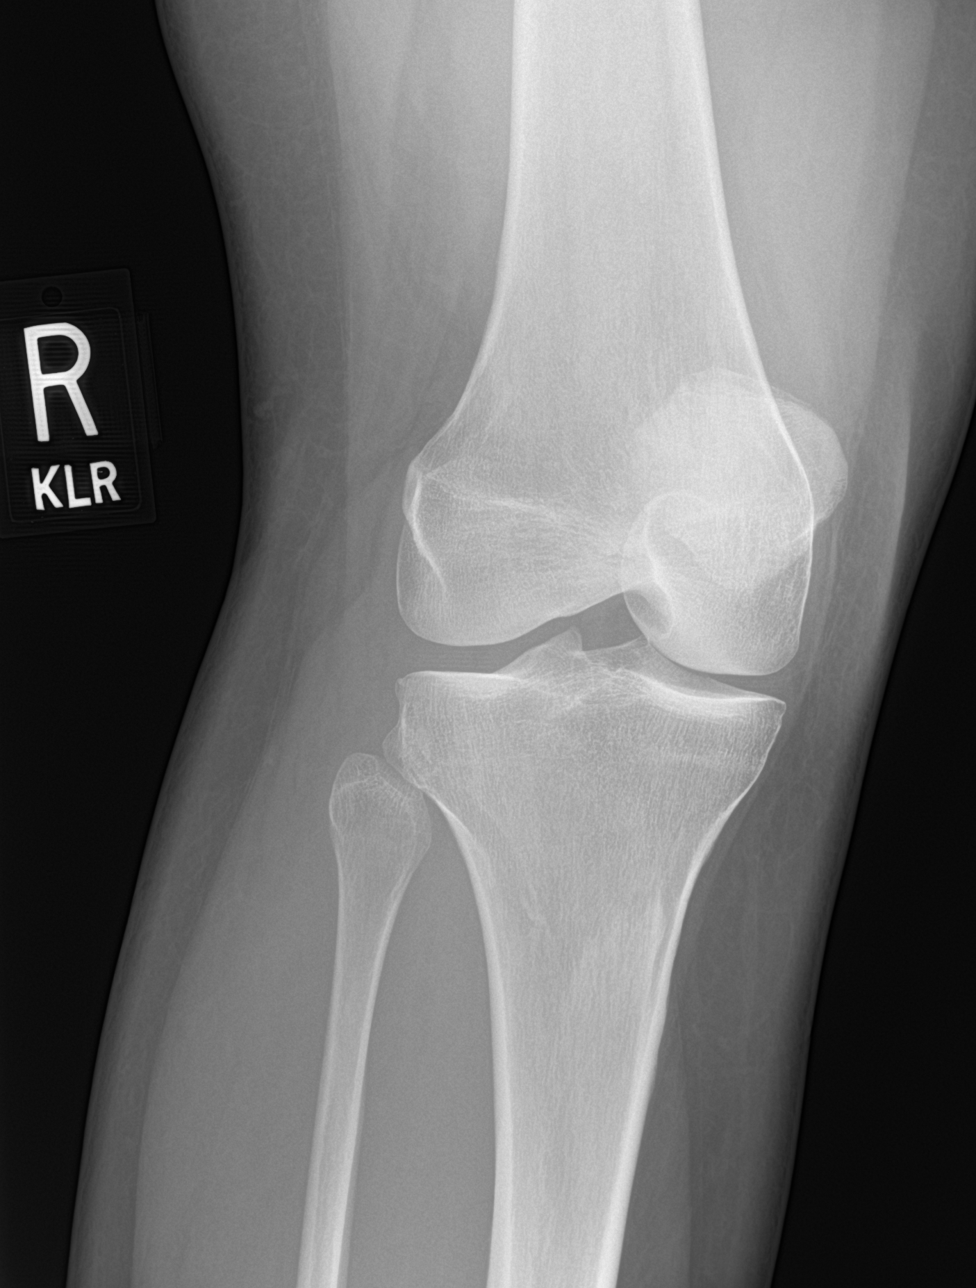

[knee obl (2 of 2)]
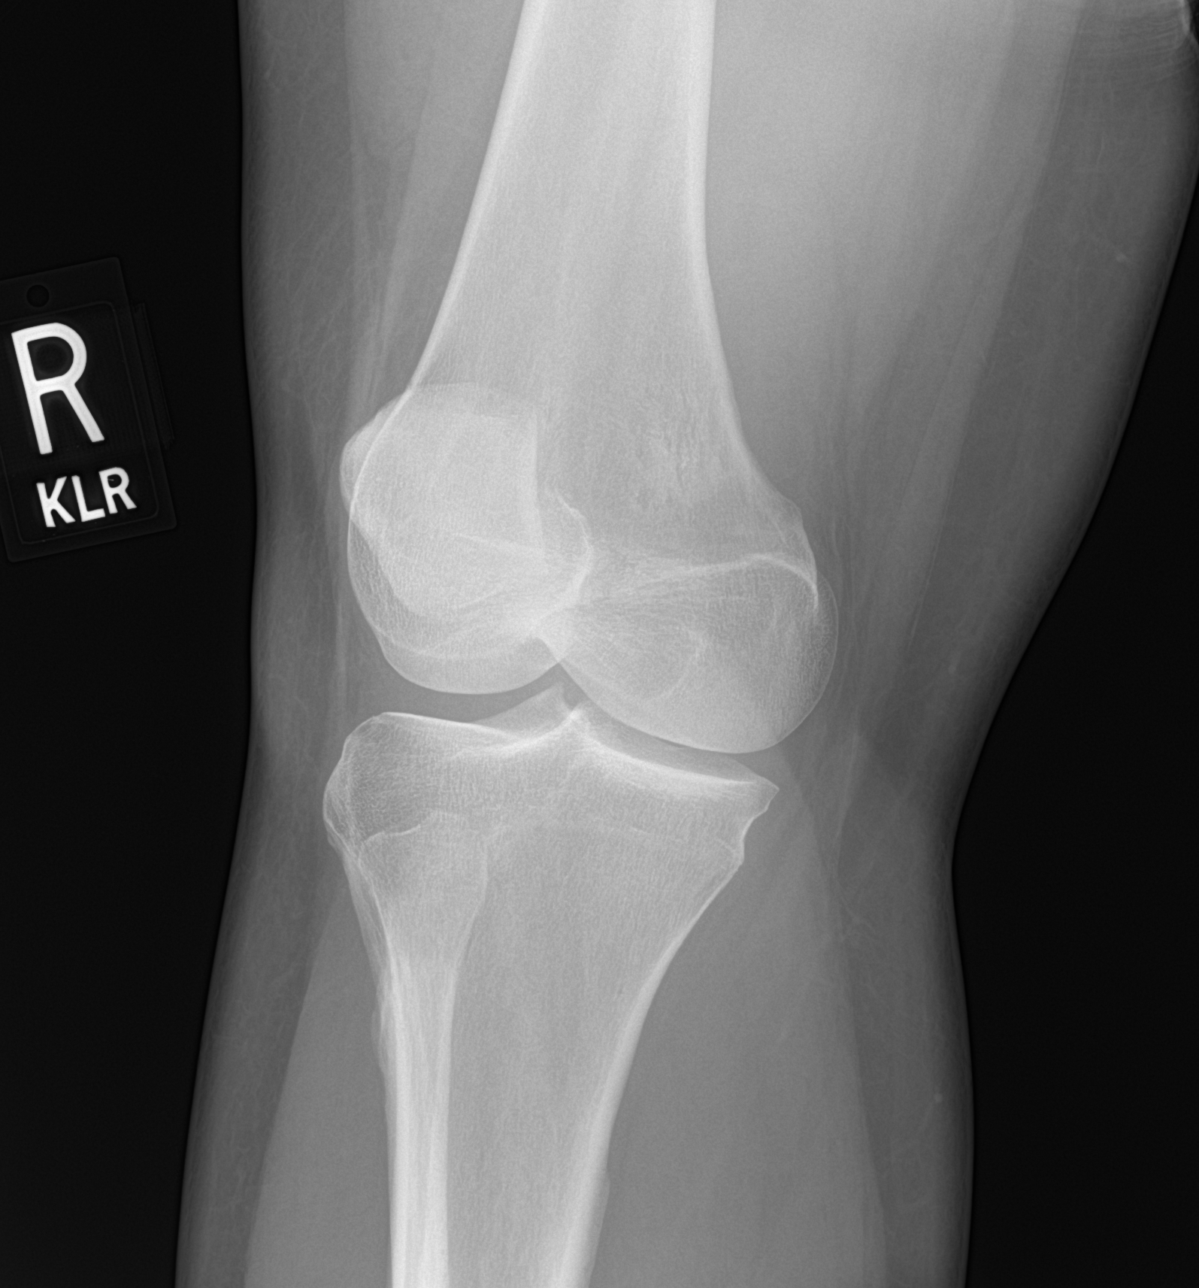

[4 of 4 positions shown; findings below may reference images not displayed]

FINDINGS: No evidence of fracture, dislocation, or joint effusion. No evidence
of arthropathy or other focal bone abnormality. Soft tissues are
unremarkable.
IMPRESSION: Negative.

## 2023-06-22 ENCOUNTER — Other Ambulatory Visit: Payer: Self-pay

## 2023-06-22 ENCOUNTER — Encounter: Payer: Self-pay | Admitting: Emergency Medicine

## 2023-06-22 ENCOUNTER — Emergency Department
Admission: EM | Admit: 2023-06-22 | Discharge: 2023-06-22 | Disposition: A | Discharge status: Home / Self Care | Payer: 59 | Attending: Emergency Medicine | Admitting: Emergency Medicine

## 2023-06-22 DIAGNOSIS — M542 Cervicalgia: Secondary | ICD-10-CM | POA: Diagnosis not present

## 2023-06-22 DIAGNOSIS — M436 Torticollis: Secondary | ICD-10-CM | POA: Insufficient documentation

## 2023-06-22 MED ORDER — BACLOFEN 10 MG PO TABS: 10.0000 mg | ORAL_TABLET | Freq: Three times a day (TID) | ORAL | 0 refills | Status: AC

## 2023-06-22 MED ORDER — MELOXICAM 15 MG PO TABS: 15.0000 mg | ORAL_TABLET | Freq: Every day | ORAL | 0 refills | Status: AC

## 2023-06-22 MED ORDER — KETOROLAC TROMETHAMINE 15 MG/ML IJ SOLN
15.0000 mg | Freq: Once | INTRAMUSCULAR | Status: AC
  Administered 2023-06-22: 15 mg via INTRAMUSCULAR
  Filled 2023-06-22: qty 1

## 2023-06-22 NOTE — ED Triage Notes (Signed)
Pt reports cramping pain to right shoulder and denies any injury. Pt reports on the left side of her neck she had an episode of sharp shooting pain last week that caused her vision to go white.

## 2023-06-22 NOTE — ED Notes (Signed)
See triage note  Presents with pain to neck  States she had similar episode last week   but woke up with pain this am

## 2023-06-22 NOTE — ED Provider Notes (Signed)
[facility] Provider Note    Event Date/Time   First MD Initiated Contact with Patient 06/22/23 [phone]     (approximate)   History   Shoulder Pain and Neck Pain   HPI  Tonya Rodriguez is a 32 y.o. female with no significant past medical history presents emergency department with right shoulder pain.  States pain radiates from the neck into her shoulder.  Had same symptoms on the left side few days ago.  No numbness or tingling.  No chest pain/shortness of breath.  No headache.  No fever or chills      Physical Exam   Triage Vital Signs: ED Triage Vitals  Encounter Vitals Group     BP 06/22/23 0349 (!) 137/98     Systolic BP Percentile --      Diastolic BP Percentile --      Pulse Rate 06/22/23 0349 (!) 57     Resp 06/22/23 0349 18     Temp 06/22/23 0349 98.2 F (36.8 C)     Temp Source 06/22/23 0349 Oral     SpO2 06/22/23 0349 98 %     Weight 06/22/23 0350 185 lb (83.9 kg)     Height 06/22/23 0729 5\' 7"  (1.702 m)     Head Circumference --      Peak Flow --      Pain Score 06/22/23 0349 8     Pain Loc --      Pain Education --      Exclude from Growth Chart --     Most recent vital signs: Vitals:   06/22/23 0349 06/22/23 0741  BP: (!) 137/98 130/88  Pulse: (!) 57 60  Resp: 18 18  Temp: 98.2 F (36.8 C) 98 F (36.7 C)  SpO2: 98% 98%     General: Awake, no distress.   CV:  Good peripheral perfusion. regular rate and  rhythm Resp:  Normal effort. Abd:  No distention.   Other:  C-spine nontender, large spasms noted in the trapezius and upper back especially to the right.  Grips equal bilaterally, neurovascular intact   ED Results / Procedures / Treatments   Labs (all labs ordered are listed, but only abnormal results are displayed) Labs Reviewed - No data to display   EKG     RADIOLOGY     PROCEDURES:   Procedures   MEDICATIONS ORDERED IN ED: Medications  ketorolac (TORADOL) 15 MG/ML injection 15 mg (has no  administration in time range)     IMPRESSION / MDM / ASSESSMENT AND PLAN / ED COURSE  I reviewed the triage vital signs and the nursing notes.                              Differential diagnosis includes, but is not limited to, torticollis, cervical radiculopathy, muscle spasm, MI  Patient's presentation is most consistent with acute complicated illness / injury requiring diagnostic workup.   MI is less likely as patient's had no chest pain or shortness of breath.  Has had intermittent symptoms over the past few days that are on 2 different sides of her body.  Feel that this is more of a stress/muscle spasm reaction at this time.  Feels to be more orthopedic in nature.  Will do a trial of Toradol injection here for pain.  Patient is driving so we will give her prescription for a muscle relaxer and meloxicam.  Patient was  also given a work note.  She is to follow-up with orthopedics not improving 1 week.  Return emergency department for any chest pain/shortness of breath.  She is in agreement treatment plan.  Discharged stable condition.      FINAL CLINICAL IMPRESSION(S) / ED DIAGNOSES   Final diagnoses:  Torticollis  Neck pain     Rx / DC Orders   ED Discharge Orders          Ordered    meloxicam (MOBIC) 15 MG tablet  Daily        06/22/23 0751    baclofen (LIORESAL) 10 MG tablet  3 times daily        06/22/23 0751             Note:  This document was prepared using Dragon voice recognition software and may include unintentional dictation errors.    Faythe Ghee, PA-C 06/22/23 4098    Janith Lima, MD 06/23/23 [phone]
